# Patient Record
Sex: Female | Born: 1975 | Race: White | Hispanic: Yes | Marital: Married | Smoking: Never smoker
Health system: Southern US, Community
[De-identification: ages and names within clinical notes are randomized; demographics above are authoritative.]

## PROBLEM LIST (undated history)

## (undated) ENCOUNTER — Inpatient Hospital Stay: Payer: Self-pay

## (undated) DIAGNOSIS — T7840XA Allergy, unspecified, initial encounter: Secondary | ICD-10-CM

## (undated) DIAGNOSIS — J45909 Unspecified asthma, uncomplicated: Secondary | ICD-10-CM

## (undated) HISTORY — DX: Allergy, unspecified, initial encounter: T78.40XA

## (undated) HISTORY — PX: DILATION AND CURETTAGE OF UTERUS: SHX78

## (undated) HISTORY — DX: Unspecified asthma, uncomplicated: J45.909

## (undated) HISTORY — PX: LEEP: SHX91

---

## 2013-08-14 ENCOUNTER — Ambulatory Visit (INDEPENDENT_AMBULATORY_CARE_PROVIDER_SITE_OTHER): Payer: 59 | Admitting: Medical

## 2013-08-14 ENCOUNTER — Encounter: Payer: Self-pay | Admitting: Medical

## 2013-08-14 VITALS — BP 100/80 | HR 88 | Temp 98.2°F | Resp 16 | Ht 65.0 in | Wt 143.0 lb

## 2013-08-14 DIAGNOSIS — J029 Acute pharyngitis, unspecified: Secondary | ICD-10-CM

## 2013-08-14 DIAGNOSIS — R51 Headache: Secondary | ICD-10-CM

## 2013-08-14 DIAGNOSIS — R509 Fever, unspecified: Secondary | ICD-10-CM

## 2013-08-14 LAB — CBC WITH DIFFERENTIAL/PLATELET
HCT: 39.9 % (ref 36.0–46.0)
Hemoglobin: 13.5 g/dL (ref 12.0–15.0)
Lymphocytes Relative: 44 % (ref 12–46)
Lymphs Abs: 3.3 10*3/uL (ref 0.7–4.0)
MCH: 29.5 pg (ref 26.0–34.0)
MCV: 87.1 fL (ref 78.0–100.0)
Monocytes Absolute: 0.7 10*3/uL (ref 0.1–1.0)
Monocytes Relative: 9 % (ref 3–12)
Platelets: 129 10*3/uL — ABNORMAL LOW (ref 150–400)
RBC: 4.58 MIL/uL (ref 3.87–5.11)
WBC: 7.5 10*3/uL (ref 4.0–10.5)

## 2013-08-14 LAB — BASIC METABOLIC PANEL
BUN: 8 mg/dL (ref 6–23)
Calcium: 9.7 mg/dL (ref 8.4–10.5)
Chloride: 102 mEq/L (ref 96–112)
Creat: 0.8 mg/dL (ref 0.50–1.10)

## 2013-08-14 LAB — MONONUCLEOSIS SCREEN: Mono Screen: NEGATIVE

## 2013-08-14 MED ORDER — PROMETHAZINE HCL 25 MG/ML IJ SOLN
25.0000 mg | Freq: Once | INTRAMUSCULAR | Status: AC
  Administered 2013-08-14: 25 mg via INTRAMUSCULAR

## 2013-08-14 MED ORDER — METHYLPREDNISOLONE SODIUM SUCC 125 MG IJ SOLR
125.0000 mg | Freq: Once | INTRAMUSCULAR | Status: AC
  Administered 2013-08-14: 125 mg via INTRAMUSCULAR

## 2013-08-14 NOTE — Progress Notes (Signed)
Subjective: Here as a new patient today.  Moved from [place] to here back in March.  Friday over a week ago had a canker sore in her mouth, couldn't eat for a few days.  Wasn't feeling good through the weekend.  Started taking tea, airborne OTC.  Then last Monday 8 days ago had a migraine, nausea, went to urgent care, had shot for nausea and pain.   Sent her home.   She later that day got fever and chills.  Started getting bad sore throat over night.   [facility], was + for strep.  Was given penicillin for 10 days.  Was also given cough syrup that makes her drowsy.  By the end of the week just wasn't feeling much better, still running fever.   Went back to urgent care Friday 3 days ago.  Was advised she may have sinus infection too.  Was changed to different antibiotic, 5 day script and prednisone, headache medication too (butalbital) that does help the headaches.   She only used 1 tablet of prednisone 50mg .  Currently has headache, less than the migraine, but intense last night, fever 100.8 last night, ears feel clogged, pain in upper back and neck/soreness, occasional chills.  Used ibuprofen last night, tylenol this morning.  Finished zpak this morning.  No rash, no abdominal pain, no vomiting or diarrhea, no GU c/o.  No joint swelling.   ROS as in subjective  Objective: General: Well-developed, well-nourished, ill-appearing, lying on exam table with cloth blocking her eyes Skin: Warm to hot, dry, no rash HEENT: Tender over frontal and maxillary sinus and tender over her head in general, TMs pearly, nares with turbinate edema and clear discharge, pharynx with mild erythema Neck: Supple, mild generalized tenderness, normal range of motion, no lymphadenopathy or mass Lungs clear Heart: Regular in rhythm, normal S1, S2, no murmurs Abdomen: Positive bowel sounds, soft, nontender, no mass, no organomegaly Back: Nontender Extremities: no edema Neuro: CN II through XII intact,  alert and oriented x3, normal strength and sensation, normal DTRs, nonfocal exam, no meningeal signs   Assessment: Encounter Diagnoses  Name Primary?  . Headache(784.0) Yes  . Fever, unspecified   . Sore throat     Plan: Discussed her concerns, her recent evaluation treatments.  At this point she has been on one course of azithromycin for 5 days, at least 3-4 days of penicillin, she really does not have much cough, no obvious meningitis. She apparently was positive for strep last week. At this point it appears that she has a moderate headache that is the primary problem, still getting over a respiratory infection.  We discussed treatment options, we will check some labs today. We ended up giving 125 mg Solu-Medrol and 25 mg of Phenergan, discussed risk and benefits of both medications. She will go home and rest and increase her water intake and try to stay hydrated. We will call with lab results, and advised her to recheck her call back if not seeing significant improvement within the next 36 hours.  She voices understanding and agreement with plan. She will hold off on any more antibiotics, she will hold off on taking the prednisone that she has, and her friend will take her home and watch over her.

## 2013-08-14 NOTE — Patient Instructions (Signed)
We gave you a shot of Solu-Medrol and Phenergan in our office today.  Go home and rest in the bed, stay hydrated throughout the day with plenty of clear fluids such as water and ginger ale, soup broth etc.  Eat bland small portions today so this won't weigh heavy on your stomach.  We will call with lab results.  If you are no better in regards to the headache by lunch time, you can take another of the butalbital tablet.  You can take the Tessalon cough tablets if needed  At this point unless we call you back with other directions, stop the penicillin, stop the prednisone, stop Tylenol.   If not much better by tomorrow, or further concerns call back.

## 2013-08-17 ENCOUNTER — Telehealth: Payer: Self-pay | Admitting: Internal Medicine

## 2013-08-17 ENCOUNTER — Other Ambulatory Visit: Payer: Self-pay | Admitting: Medical

## 2013-08-17 ENCOUNTER — Ambulatory Visit
Admission: RE | Admit: 2013-08-17 | Discharge: 2013-08-17 | Disposition: A | Discharge status: Home / Self Care | Payer: 59 | Source: Ambulatory Visit | Attending: Medical | Admitting: Medical

## 2013-08-17 DIAGNOSIS — R509 Fever, unspecified: Secondary | ICD-10-CM

## 2013-08-17 NOTE — Telephone Encounter (Signed)
Pt states that her fever has been still high, last night was 101.4  She took some meds and it went down to 100.4 and she didn't have a fever this morning but she is having night sweats. She wanted to know what to do.

## 2013-08-17 NOTE — Telephone Encounter (Signed)
pls call and get details on all her symptoms currently.  Also, how bad is headache currently?  Any rash?  Any better, way worse??

## 2013-08-17 NOTE — Telephone Encounter (Signed)
Patient states that she doesn't have a HA. She said her head feels heavy. She feels light headed and fever keep coming back at night 101.8 and the night sweats. CLS

## 2013-08-20 ENCOUNTER — Telehealth: Payer: Self-pay | Admitting: Family Medicine

## 2013-08-20 NOTE — Telephone Encounter (Signed)
i assume she has finished the penicillin.  Is she using any OTC decongestant, sudafed, or other?  Any other symptoms at this time?

## 2013-08-20 NOTE — Telephone Encounter (Signed)
C/t penicillin.  Can do sudafed or benadryl or other sinus decongestant if not allergic or have high blood pressure

## 2013-08-20 NOTE — Telephone Encounter (Signed)
Patient states that she still has 4 to 5 days left of the PCN. She is not taking a decongestant. She is only taking Advil. She doesn't have any other Sx than what I type earlier. She said that she was going to go by the pharmacy and get some Sudafed and she wants to know would it be okay to take this along with everything else she is taking. CLS

## 2013-08-20 NOTE — Telephone Encounter (Signed)
Patient is aware of Morgan Pham PAC recommendations. CLS 

## 2013-08-20 NOTE — Telephone Encounter (Signed)
Patient called to up date her Sx. She said that she is not 100% today but better than she was. She slept all night but she states that she feels pressure behind her left side of her eye. Is there anything she needs to do next? CLS [phone]

## 2013-08-21 NOTE — Telephone Encounter (Signed)
Pt states she has broke out in a rash this morning

## 2013-08-21 NOTE — Telephone Encounter (Signed)
I spoke with the patient and she has an appointment for 08/22/13. CLS

## 2013-08-21 NOTE — Telephone Encounter (Signed)
Let us just have her return

## 2013-08-22 ENCOUNTER — Ambulatory Visit (INDEPENDENT_AMBULATORY_CARE_PROVIDER_SITE_OTHER): Payer: 59 | Admitting: Medical

## 2013-08-22 ENCOUNTER — Encounter: Payer: Self-pay | Admitting: Medical

## 2013-08-22 VITALS — BP 110/70 | HR 72 | Temp 98.5°F | Resp 16 | Wt 142.0 lb

## 2013-08-22 DIAGNOSIS — R21 Rash and other nonspecific skin eruption: Secondary | ICD-10-CM

## 2013-08-22 DIAGNOSIS — J069 Acute upper respiratory infection, unspecified: Secondary | ICD-10-CM

## 2013-08-22 DIAGNOSIS — R51 Headache: Secondary | ICD-10-CM

## 2013-08-22 DIAGNOSIS — T887XXA Unspecified adverse effect of drug or medicament, initial encounter: Secondary | ICD-10-CM

## 2013-08-22 DIAGNOSIS — T50905A Adverse effect of unspecified drugs, medicaments and biological substances, initial encounter: Secondary | ICD-10-CM

## 2013-08-22 MED ORDER — FLUTICASONE PROPIONATE 50 MCG/ACT NA SUSP: 2.0000 | Freq: Every day | NASAL | Status: DC

## 2013-08-22 NOTE — Progress Notes (Signed)
  Subjective:  Morgan Pham is a 38 y.o. female who presents for recheck.  I saw her as a new patient recently for bad headache, URI, and we ended up getting CXR to rule out pneumonia.    She restarted penicillin but with a few days got a rash throughout, worse on face.  She stopped penicillin, began benadryl, and rash is improving.    She overall feels fine except a little nasal congestion at this point.  No other aggravating or relieving factors.    No other c/o.  The following portions of the patient's history were reviewed and updated as appropriate: allergies, current medications, past family history, past medical history, past social history, past surgical history and problem list.  ROS Otherwise as in subjective above  Objective: Physical Exam  BP 110/70  Pulse 72  Temp(Src) 98.5 F (36.9 C) (Oral)  Resp 16  Wt 142 lb (64.411 kg)   General appearance: alert, no distress, WD/WN Skin: faint macular rash of legs and arms, faint rash of face HEENT: normocephalic, sclerae anicteric, conjunctiva pink and moist, TMs pearly, nares with clear discharge, pharynx normal Oral cavity: MMM, no lesions Neck: supple, no lymphadenopathy, no thyromegaly, no masses Heart: RRR, normal S1, S2, no murmurs Lungs: CTA bilaterally, no wheezes, rhonchi, or rales Pulses: 2+ radial pulses, 2+ pedal pulses, normal cap refill Ext: no edema   Assessment: Encounter Diagnoses  Name Primary?  . Rash Yes  . Adverse reaction to drug, initial encounter   . Headache(784.0)   . Upper respiratory infection      Plan: Rash, likely reaction to penicillin.  Updated allergies.  Will avoid penicillins.  She has stopped PCN Headache - resolved URI - mostly resolved.   Can use benadryl and flonase for another week or so, rest, hydrate well, return prn

## 2013-08-22 NOTE — Patient Instructions (Signed)
Currently you are mostly resolved with the respiratory infection.  Current recommendations:  Begin Flonase nasal spray, 1-2 sprays twice daily.  Do this for 1-2 weeks, then stop  Continue Benadryl OTC, 1/2-1 tablet twice daily for another week or so  Drink plenty of water  Use nasal saline flush twice daily for a week   And if not completely resolved within 10-14 days , then let me know  Using Saline Nose Drops with Bulb Syringe A bulb syringe is used to clear your nose. You may use it when you have a stuffy nose, nasal congestion, sinus pressure, or sneezing.   SALINE SOLUTION You can buy nose drops at your local drug store. You can also make nose drops yourself. Mix 1 cup of water with  teaspoon of salt. Stir. Store this mixture at room temperature. Make a new batch daily.  USE THE BULB IN COMBINATION WITH SALINE NOSE DROPS  Squeeze the air out of the bulb before suctioning the saline mixture.  While still squeezing the bulb flat, place the tip of the bulb into the saline mixture.  Let air come back into the bulb.  This will suction up the saline mixture.  Gently flush one nostril at a time.  Salt water nose drops will then moisten your  congested nose and loosen secretions before suctioning.  Use the bulb syringe as directed below to suction.  USING THE BULB SYRINGE TO SUCTION  While still squeezing the bulb flat, place the tip of the bulb into a nostril. Let air come back into the bulb. The suction will pull snot out of the nose and into the bulb.  Repeat on the other nostril.  Squeeze syringe several times into a tissue.  CLEANING THE BULB SYRINGE  Clean the bulb syringe every day with hot soapy water.  Clean the inside of the bulb by squeezing the bulb while the tip is in soapy water.  Rinse by squeezing the bulb while the tip is in clean hot water.  Store the bulb with the tip side down on paper towel.  HOME CARE INSTRUCTIONS   Use saline nose drops often  to keep the nose open and not stuffy.  Throw away used salt water. Make a new solution every time.  Do not use the same solution and dropper for another person  If you do not prefer to use nasal saline flush, other options include nasal saline spray or the EchoStareti Pot, both of which are available over the counter at your pharmacy.

## 2014-10-21 IMAGING — CR DG CHEST 2V
2 series · 2 of 2 positions shown · non-contrast
Comparison: None.

CLINICAL DATA: Cough

EXAM:
CHEST  2 VIEW

[view not recorded (1 of 2)]
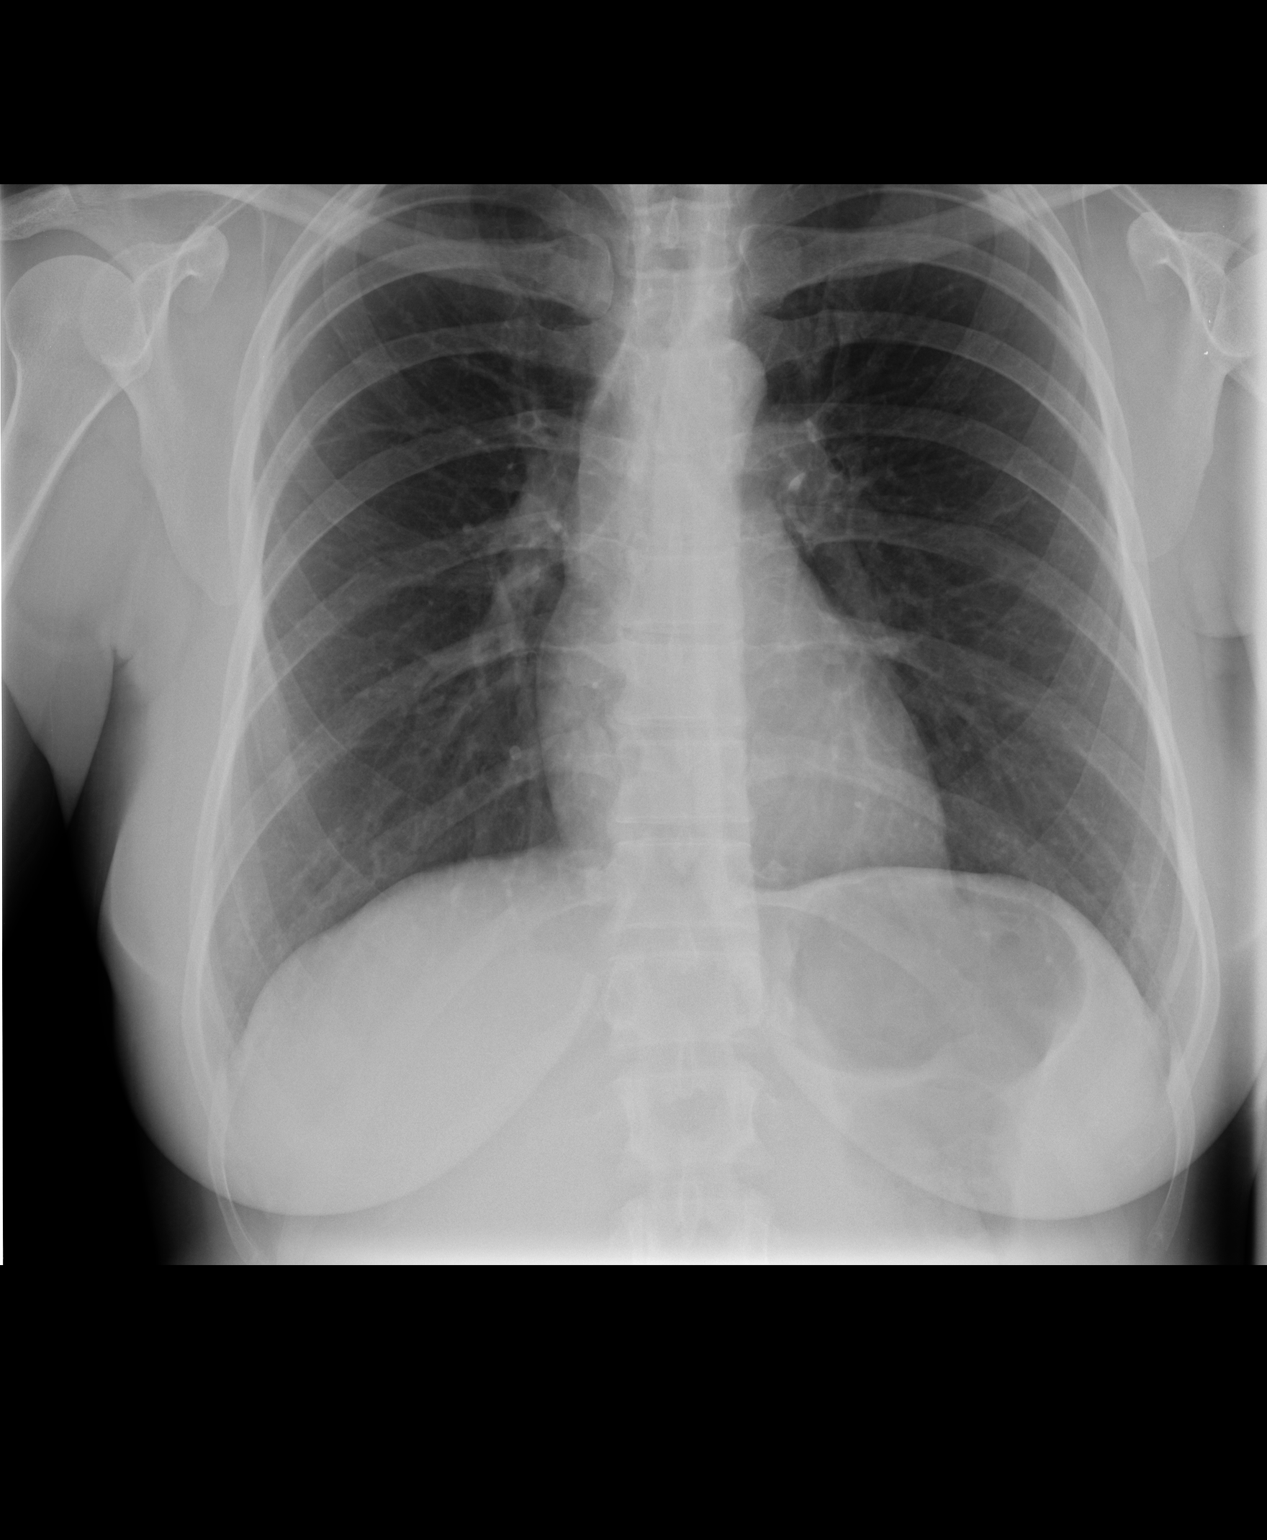

[view not recorded (2 of 2)]
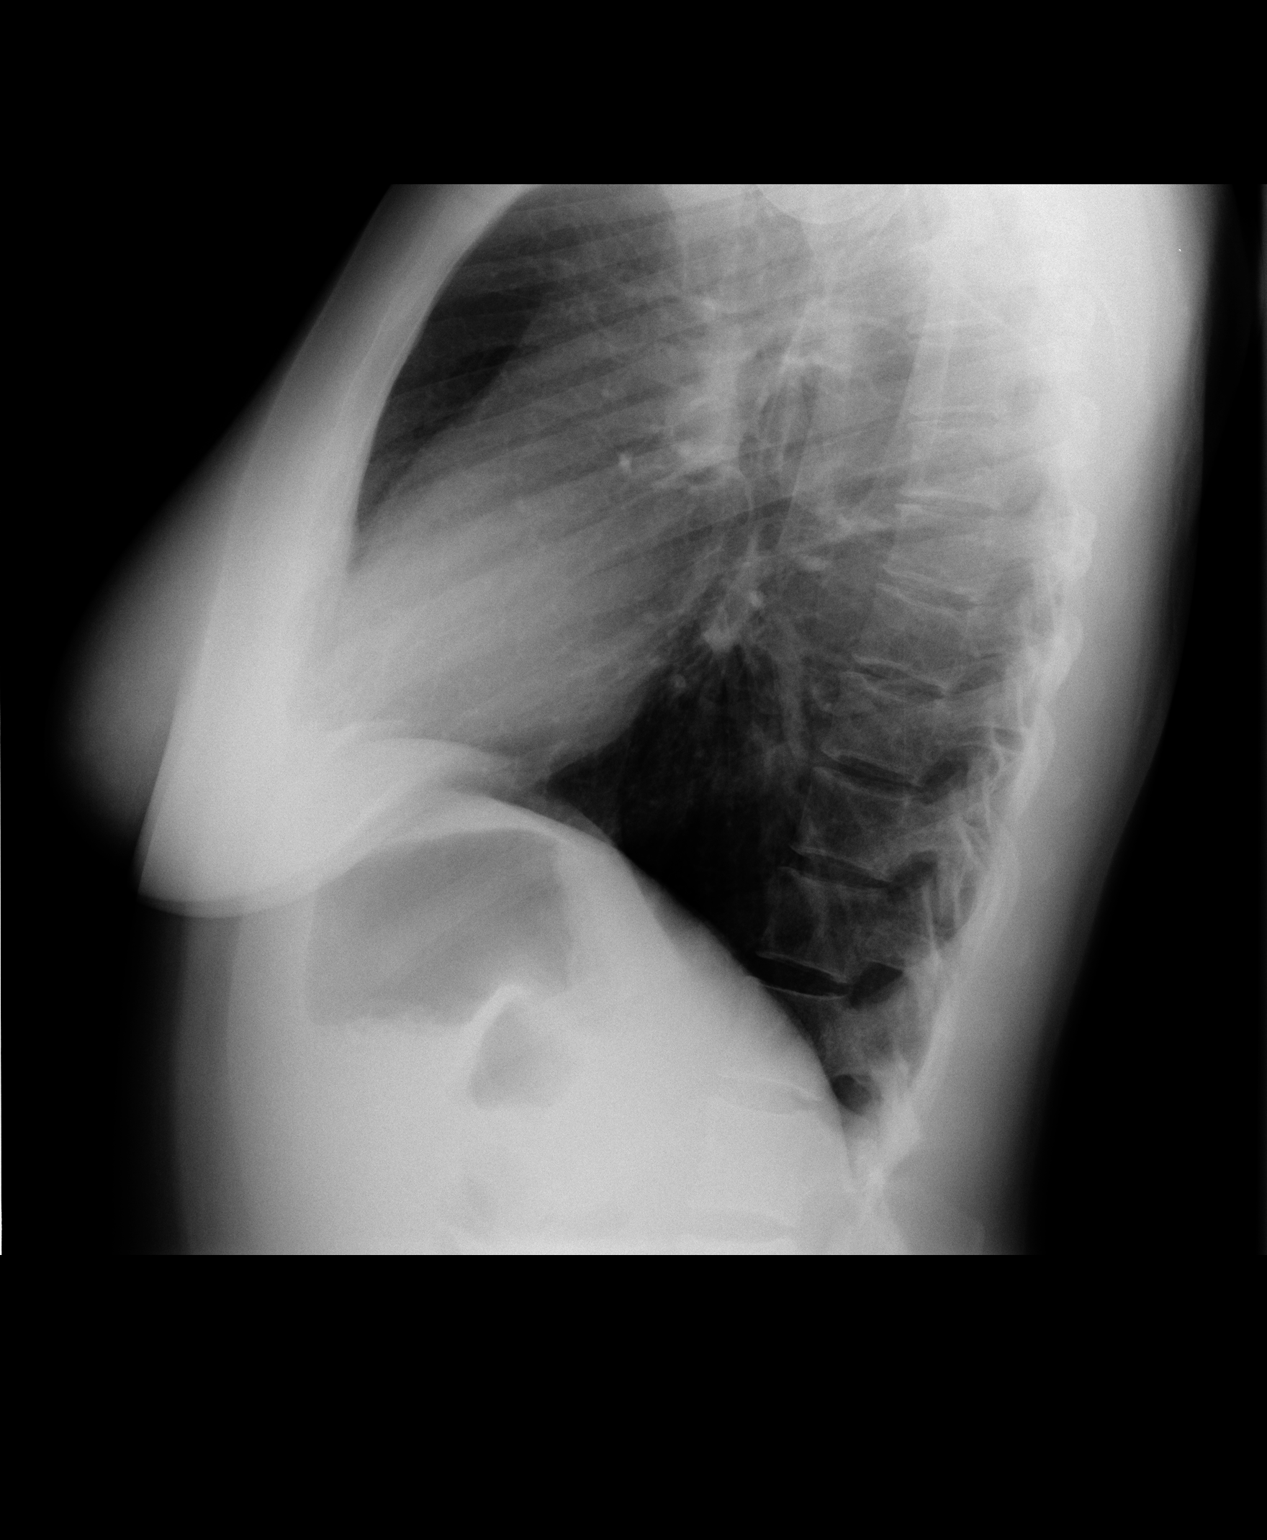

[2 of 2 positions shown; findings below may reference images not displayed]

FINDINGS: The heart size and mediastinal contours are within normal limits.
Both lungs are clear. The visualized skeletal structures are
unremarkable.
IMPRESSION: No active cardiopulmonary disease.

## 2015-06-02 ENCOUNTER — Telehealth: Payer: Self-pay | Admitting: Obstetrics and Gynecology

## 2015-06-02 ENCOUNTER — Encounter: Payer: Self-pay | Admitting: Family Medicine

## 2015-06-02 ENCOUNTER — Ambulatory Visit (INDEPENDENT_AMBULATORY_CARE_PROVIDER_SITE_OTHER): Payer: 59 | Admitting: Family Medicine

## 2015-06-02 VITALS — BP 118/78 | HR 68 | Temp 98.0°F | Wt 160.2 lb

## 2015-06-02 DIAGNOSIS — R42 Dizziness and giddiness: Secondary | ICD-10-CM | POA: Diagnosis not present

## 2015-06-02 DIAGNOSIS — Z8639 Personal history of other endocrine, nutritional and metabolic disease: Secondary | ICD-10-CM

## 2015-06-02 DIAGNOSIS — N939 Abnormal uterine and vaginal bleeding, unspecified: Secondary | ICD-10-CM | POA: Diagnosis not present

## 2015-06-02 DIAGNOSIS — R5383 Other fatigue: Secondary | ICD-10-CM | POA: Diagnosis not present

## 2015-06-02 DIAGNOSIS — Z23 Encounter for immunization: Secondary | ICD-10-CM

## 2015-06-02 DIAGNOSIS — N923 Ovulation bleeding: Secondary | ICD-10-CM

## 2015-06-02 DIAGNOSIS — K148 Other diseases of tongue: Secondary | ICD-10-CM

## 2015-06-02 LAB — CBC WITH DIFFERENTIAL/PLATELET
BASOS PCT: 0 % (ref 0–1)
Basophils Absolute: 0 10*3/uL (ref 0.0–0.1)
EOS ABS: 0.1 10*3/uL (ref 0.0–0.7)
Eosinophils Relative: 2 % (ref 0–5)
HCT: 38.3 % (ref 36.0–46.0)
HEMOGLOBIN: 12.5 g/dL (ref 12.0–15.0)
Lymphocytes Relative: 32 % (ref 12–46)
Lymphs Abs: 2.3 10*3/uL (ref 0.7–4.0)
MCH: 28.3 pg (ref 26.0–34.0)
MCHC: 32.6 g/dL (ref 30.0–36.0)
MCV: 86.8 fL (ref 78.0–100.0)
MONO ABS: 0.3 10*3/uL (ref 0.1–1.0)
MONOS PCT: 4 % (ref 3–12)
MPV: 12.5 fL — ABNORMAL HIGH (ref 8.6–12.4)
NEUTROS ABS: 4.4 10*3/uL (ref 1.7–7.7)
Neutrophils Relative %: 62 % (ref 43–77)
Platelets: 198 10*3/uL (ref 150–400)
RBC: 4.41 MIL/uL (ref 3.87–5.11)
RDW: 13.3 % (ref 11.5–15.5)
WBC: 7.1 10*3/uL (ref 4.0–10.5)

## 2015-06-02 LAB — COMPREHENSIVE METABOLIC PANEL
ALBUMIN: 4.2 g/dL (ref 3.6–5.1)
ALT: 18 U/L (ref 6–29)
AST: 18 U/L (ref 10–30)
Alkaline Phosphatase: 51 U/L (ref 33–115)
BUN: 13 mg/dL (ref 7–25)
CHLORIDE: 105 mmol/L (ref 98–110)
CO2: 21 mmol/L (ref 20–31)
CREATININE: 0.79 mg/dL (ref 0.50–1.10)
Calcium: 9.3 mg/dL (ref 8.6–10.2)
Glucose, Bld: 93 mg/dL (ref 65–99)
Potassium: 4.4 mmol/L (ref 3.5–5.3)
SODIUM: 136 mmol/L (ref 135–146)
Total Bilirubin: 0.3 mg/dL (ref 0.2–1.2)
Total Protein: 7.3 g/dL (ref 6.1–8.1)

## 2015-06-02 NOTE — Telephone Encounter (Signed)
Pt is bleding after sex, has a rash on inner thigh area left into her vagina area

## 2015-06-02 NOTE — Progress Notes (Signed)
Subjective:    Patient ID: Morgan Pham, female    DOB: 03/05/1976, 39 y.o.   MRN: [id number]  HPI Chief Complaint  Patient presents with  . multiple issues    rash between her thigh that hasnt gone away since a week ago- irriated, itchy.. not feeling well, tired all the time had some dizzy spells last week- has been vitamin d definency (levels were fine when she went to obgyn back in may. pt has a circle on her tongue that came up and was white at first and it is faded but still there now.- does not hurt     She is here with multiple complaints.  States she has been feeling tired and wanting to sleep more than usual for past 2 weeks. States she is sleeping well but just not feeling rested when she wakes up and continues to feel like she is dragging throughout the day. Does not fall asleep during the day.  She denies feeling sad or blue and states she is able to function and perform normal daily activities. She reports having 2 brief episodes of feeling weak and dizzy last week. States both episodes occurred during the day and lasted only seconds. Denies feeling dizzy for past 4 days. Denies headache, fever, chills, unexplained weight loss, chest pain, shortness of breath, blood in stool, urinary symptoms, abdominal pain or vaginal discharge.  Reports having normal Vitamin D level in past 6 months but she has not been taking Vitamin D Reports menstrual cycle this month was abnormal, having spotting now after having her normal cycle, and she thinks this is due to new OCP. She has an OB/GYN and is following up with them for this.  Also complains of pruritic rash to her left inner thigh area for about a week. States itchiness at night. Has tried cold water and calamine lotion with some relief.  Also complains of "white spot" on tongue 2 months ago that is not as obvious, no longer white, but still present. States initially her tongue felt like she had burned it on something but that sensation quickly  went away. Reports normal sensation and taste, denies pain. She is not a smoker. States she if overdue for dental appointment, last visit 2 years ago.  Reviewed allergies, medications, past medical and social history.  Review of Systems Pertinent positives and negatives in the history of present illness    Objective:   Physical Exam  Alert and oriented and in no distress. PERRLA, EOMs intact, CN II-IX intact. Tympanic membranes and canals are normal. Pharyngeal area is normal. Neck is supple without adenopathy. Cardiac exam shows a regular rate and  rhythm without murmurs or gallops. Lungs are clear to auscultation. Tongue- approximately 1 cm in diameter round slightly ulcerated area to left mid tongue. Left thigh fold with erythematous, raised plaques that extends to left labia majora without maceration or weeping.  BP 118/78 mmHg  Pulse 68  Temp(Src) 98 F (36.7 C) (Oral)  Wt 160 lb 3.2 oz (72.666 kg) Orthostatic vitals: Not postural     Assessment & Plan:  Other fatigue - Plan: CBC with Differential/Platelet, Comprehensive metabolic panel, TSH  Dizzy - Plan: Orthostatic vital signs, CANCELED: Orthostatic vital signs  History of vitamin D deficiency  Tongue lesion  Need for prophylactic vaccination and inoculation against influenza - Plan: Flu Vaccine QUAD 36+ mos IM  Spotting between menses  Recommend that she see a dentist for tongue lesion and she is overdue for regular visit. She  will call her insurance company to find a dentist.  Discussed that she should be taking Vitamin D and a good multivitamin.  Will check labs for cause of fatigue, possible that this is related to hormones and new OCP. She will follow up with OB/GYN who prescribes OCP.  Suspect rash to left inner thigh is due to fungus. Recommended trying Lamisil over-the-counter for the next 2 weeks. Also recommended keeping the area clean and dry and avoid tight clothing. She will let me know if no  improvement. Dizziness was brief, lasting a few seconds on 2 occasions last week and she has not had any recurring dizziness. Recommend that she stay well-hydrated and eat regular meals. She will let me know if she has more episodes.  Flu shot given. Spent at least 30 minutes in counseling and coordination of care.

## 2015-06-02 NOTE — Patient Instructions (Signed)
Call your insurance company and find out which dentist they recommend.  Use Lamisil twice daily for next 2 weeks to left inner thigh and see if this helps.   Intertrigo Intertrigo is a skin condition that occurs in between folds of skin in places on the body that rub together a lot and do not get much ventilation. It is caused by heat, moisture, friction, sweat retention, and lack of air circulation, which produces red, irritated patches and, sometimes, scaling or drainage. People who have diabetes, who are obese, or who have treatment with antibiotics are at increased risk for intertrigo. The most common sites for intertrigo to occur include:  The groin.  The breasts.  The armpits.  Folds of abdominal skin.  Webbed spaces between the fingers or toes. Intertrigo may be aggravated by:  Sweat.  Feces.  Yeast or bacteria that are present near skin folds.  Urine.  Vaginal discharge. HOME CARE INSTRUCTIONS  The following steps can be taken to reduce friction and keep the affected area cool and dry:  Expose skin folds to the air.  Keep deep skin folds separated with cotton or linen cloth. Avoid tight fitting clothing that could cause chafing.  Wear open-toed shoes or sandals to help reduce moisture between the toes.  Apply absorbent powders to affected areas as directed by your caregiver.  Apply over-the-counter barrier pastes, such as zinc oxide, as directed by your caregiver.  If you develop a fungal infection in the affected area, your caregiver may have you use antifungal creams. SEEK MEDICAL CARE IF:   The rash is not improving after 1 week of treatment.  The rash is getting worse (more red, more swollen, more painful, or spreading).  You have a fever or chills. MAKE SURE YOU:   Understand these instructions.  Will watch your condition.  Will get help right away if you are not doing well or get worse.   This information is not intended to replace advice given  to you by your health care provider. Make sure you discuss any questions you have with your health care provider.   Document Released: 08/02/2005 Document Revised: 10/25/2011 Document Reviewed: 02/03/2015 Elsevier Interactive Patient Education [employer]2016 [employer]

## 2015-06-02 NOTE — Telephone Encounter (Signed)
Tried calling patient several times, na Looks as if [facility]

## 2015-06-03 LAB — TSH: TSH: 2.805 u[IU]/mL (ref 0.350–4.500)

## 2015-08-17 NOTE — L&D Delivery Note (Signed)
Delivery Summary for Sherron Alesuth Whetstine  Labor Events:   Preterm labor:   Rupture date:   Rupture time:   Rupture type: Spontaneous  Fluid Color: Clear  Induction:   Augmentation:   Complications:   Cervical ripening:          Delivery:   Episiotomy:   Lacerations:   Repair suture:   Repair # of packets:   Blood loss (ml):    Information for the patient's newborn:  Jaynie Collinsspinal, Boy Mia [[id number]][[id number]]    Delivery 03/28/2016 7:14 AM by  C-Section, Low Transverse Sex:  female Gestational Age: 4066w3d Delivery Clinician:  Hildred LaserAnika Caryl Fate, MD Living?: Yes        APGARS  One minute Five minutes Ten minutes  Skin color:  1 1    Heart rate:  2 2    Grimace:  1 2    Muscle tone:  2 2    Breathing:  2 2    Totals: 8  9      Presentation/position:      Resuscitation:   Cord information:    Disposition of cord blood:     Blood gases sent?  Complications:   Placenta: Delivered:       appearance Newborn Measurements: Weight: 4 lb 3.7 oz (1920 g)  Height:    Head circumference:    Chest circumference:    Other providers:    Additional  information: Forceps:   Vacuum:   Breech:   Observed anomalies       See Dr. Oretha Milchherry's operative note for details of C-section procedure.   Hildred LaserAnika Quantay Zaremba, MD [facility]

## 2015-09-19 ENCOUNTER — Encounter: Payer: Self-pay | Admitting: Obstetrics and Gynecology

## 2015-09-19 ENCOUNTER — Ambulatory Visit (INDEPENDENT_AMBULATORY_CARE_PROVIDER_SITE_OTHER): Payer: 59 | Admitting: Obstetrics and Gynecology

## 2015-09-19 VITALS — BP 118/74 | HR 76 | Ht 64.0 in | Wt 157.2 lb

## 2015-09-19 DIAGNOSIS — N926 Irregular menstruation, unspecified: Secondary | ICD-10-CM | POA: Diagnosis not present

## 2015-09-19 LAB — POCT URINE PREGNANCY: PREG TEST UR: POSITIVE — AB

## 2015-09-19 NOTE — Progress Notes (Signed)
Patient ID: Morgan Pham, female   DOB: 11-17-1975, 40 y.o.   MRN: [id number]  Here for pregnancy confirmation with planned pregnancy, stopped OCPs last month and is taking OTC PNVs. LMP 08/14/15, EGA [redacted]w[redacted]d, EDC 05/20/16  O: A&O x4  well groomed and excited female in no distress UPT+ Blood pressure 118/74, pulse 76, height  (1.626 m), weight 157 lb 3.2 oz (71.305 kg), last menstrual period 08/14/2015.  A: missed menses Early pregnancy  P: RTC in 2 weeks for viability scan and nurse intake & labs, then in 6 weeks for NOB PE.  Harlow Mares, CNM

## 2015-09-19 NOTE — Patient Instructions (Signed)
First Trimester of Pregnancy The first trimester of pregnancy is from week 1 until the end of week 12 (months 1 through 3). A week after a sperm fertilizes an egg, the egg will implant on the wall of the uterus. This embryo will begin to develop into a baby. Genes from you and your partner are forming the baby. The female genes determine whether the baby is a boy or a girl. At 6-8 weeks, the eyes and face are formed, and the heartbeat can be seen on ultrasound. At the end of 12 weeks, all the baby's organs are formed.  Now that you are pregnant, you will want to do everything you can to have a healthy baby. Two of the most important things are to get good prenatal care and to follow your health care provider's instructions. Prenatal care is all the medical care you receive before the baby's birth. This care will help prevent, find, and treat any problems during the pregnancy and childbirth. BODY CHANGES Your body goes through many changes during pregnancy. The changes vary from woman to woman.   You may gain or lose a couple of pounds at first.  You may feel sick to your stomach (nauseous) and throw up (vomit). If the vomiting is uncontrollable, call your health care provider.  You may tire easily.  You may develop headaches that can be relieved by medicines approved by your health care provider.  You may urinate more often. Painful urination may mean you have a bladder infection.  You may develop heartburn as a result of your pregnancy.  You may develop constipation because certain hormones are causing the muscles that push waste through your intestines to slow down.  You may develop hemorrhoids or swollen, bulging veins (varicose veins).  Your breasts may begin to grow larger and become tender. Your nipples may stick out more, and the tissue that surrounds them (areola) may become darker.  Your gums may bleed and may be sensitive to brushing and flossing.  Dark spots or blotches (chloasma,  mask of pregnancy) may develop on your face. This will likely fade after the baby is born.  Your menstrual periods will stop.  You may have a loss of appetite.  You may develop cravings for certain kinds of food.  You may have changes in your emotions from day to day, such as being excited to be pregnant or being concerned that something may go wrong with the pregnancy and baby.  You may have more vivid and strange dreams.  You may have changes in your hair. These can include thickening of your hair, rapid growth, and changes in texture. Some women also have hair loss during or after pregnancy, or hair that feels dry or thin. Your hair will most likely return to normal after your baby is born. WHAT TO EXPECT AT YOUR PRENATAL VISITS During a routine prenatal visit:  You will be weighed to make sure you and the baby are growing normally.  Your blood pressure will be taken.  Your abdomen will be measured to track your baby's growth.  The fetal heartbeat will be listened to starting around week 10 or 12 of your pregnancy.  Test results from any previous visits will be discussed. Your health care provider may ask you:  How you are feeling.  If you are feeling the baby move.  If you have had any abnormal symptoms, such as leaking fluid, bleeding, severe headaches, or abdominal cramping.  If you are using any tobacco products,   including cigarettes, chewing tobacco, and electronic cigarettes.  If you have any questions. Other tests that may be performed during your first trimester include:  Blood tests to find your blood type and to check for the presence of any previous infections. They will also be used to check for low iron levels (anemia) and Rh antibodies. Later in the pregnancy, blood tests for diabetes will be done along with other tests if problems develop.  Urine tests to check for infections, diabetes, or protein in the urine.  An ultrasound to confirm the proper growth  and development of the baby.  An amniocentesis to check for possible genetic problems.  Fetal screens for spina bifida and Down syndrome.  You may need other tests to make sure you and the baby are doing well.  HIV (human immunodeficiency virus) testing. Routine prenatal testing includes screening for HIV, unless you choose not to have this test. HOME CARE INSTRUCTIONS  Medicines  Follow your health care provider's instructions regarding medicine use. Specific medicines may be either safe or unsafe to take during pregnancy.  Take your prenatal vitamins as directed.  If you develop constipation, try taking a stool softener if your health care provider approves. Diet  Eat regular, well-balanced meals. Choose a variety of foods, such as meat or vegetable-based protein, fish, milk and low-fat dairy products, vegetables, fruits, and whole grain breads and cereals. Your health care provider will help you determine the amount of weight gain that is right for you.  Avoid raw meat and uncooked cheese. These carry germs that can cause birth defects in the baby.  Eating four or five small meals rather than three large meals a day may help relieve nausea and vomiting. If you start to feel nauseous, eating a few soda crackers can be helpful. Drinking liquids between meals instead of during meals also seems to help nausea and vomiting.  If you develop constipation, eat more high-fiber foods, such as fresh vegetables or fruit and whole grains. Drink enough fluids to keep your urine clear or pale yellow. Activity and Exercise  Exercise only as directed by your health care provider. Exercising will help you:  Control your weight.  Stay in shape.  Be prepared for labor and delivery.  Experiencing pain or cramping in the lower abdomen or low back is a good sign that you should stop exercising. Check with your health care provider before continuing normal exercises.  Try to avoid standing for long  periods of time. Move your legs often if you must stand in one place for a long time.  Avoid heavy lifting.  Wear low-heeled shoes, and practice good posture.  You may continue to have sex unless your health care provider directs you otherwise. Relief of Pain or Discomfort  Wear a good support bra for breast tenderness.   Take warm sitz baths to soothe any pain or discomfort caused by hemorrhoids. Use hemorrhoid cream if your health care provider approves.   Rest with your legs elevated if you have leg cramps or low back pain.  If you develop varicose veins in your legs, wear support hose. Elevate your feet for 15 minutes, 3-4 times a day. Limit salt in your diet. Prenatal Care  Schedule your prenatal visits by the twelfth week of pregnancy. They are usually scheduled monthly at first, then more often in the last 2 months before delivery.  Write down your questions. Take them to your prenatal visits.  Keep all your prenatal visits as directed by your   health care provider. Safety  Wear your seat belt at all times when driving.  Make a list of emergency phone numbers, including numbers for family, friends, the hospital, and police and fire departments. General Tips  Ask your health care provider for a referral to a local prenatal education class. Begin classes no later than at the beginning of month 6 of your pregnancy.  Ask for help if you have counseling or nutritional needs during pregnancy. Your health care provider can offer advice or refer you to specialists for help with various needs.  Do not use hot tubs, steam rooms, or saunas.  Do not douche or use tampons or scented sanitary pads.  Do not cross your legs for long periods of time.  Avoid cat litter boxes and soil used by cats. These carry germs that can cause birth defects in the baby and possibly loss of the fetus by miscarriage or stillbirth.  Avoid all smoking, herbs, alcohol, and medicines not prescribed by  your health care provider. Chemicals in these affect the formation and growth of the baby.  Do not use any tobacco products, including cigarettes, chewing tobacco, and electronic cigarettes. If you need help quitting, ask your health care provider. You may receive counseling support and other resources to help you quit.  Schedule a dentist appointment. At home, brush your teeth with a soft toothbrush and be gentle when you floss. SEEK MEDICAL CARE IF:   You have dizziness.  You have mild pelvic cramps, pelvic pressure, or nagging pain in the abdominal area.  You have persistent nausea, vomiting, or diarrhea.  You have a bad smelling vaginal discharge.  You have pain with urination.  You notice increased swelling in your face, hands, legs, or ankles. SEEK IMMEDIATE MEDICAL CARE IF:   You have a fever.  You are leaking fluid from your vagina.  You have spotting or bleeding from your vagina.  You have severe abdominal cramping or pain.  You have rapid weight gain or loss.  You vomit blood or material that looks like coffee grounds.  You are exposed to [place] measles and have never had them.  You are exposed to fifth disease or chickenpox.  You develop a severe headache.  You have shortness of breath.  You have any kind of trauma, such as from a fall or a car accident.   This information is not intended to replace advice given to you by your health care provider. Make sure you discuss any questions you have with your health care provider.   Document Released: 07/27/2001 Document Revised: 08/23/2014 Document Reviewed: 06/12/2013 Elsevier Interactive Patient Education 2016 [employer]  

## 2015-10-02 ENCOUNTER — Ambulatory Visit (INDEPENDENT_AMBULATORY_CARE_PROVIDER_SITE_OTHER): Payer: 59

## 2015-10-02 ENCOUNTER — Ambulatory Visit (INDEPENDENT_AMBULATORY_CARE_PROVIDER_SITE_OTHER): Payer: 59 | Admitting: Obstetrics and Gynecology

## 2015-10-02 VITALS — BP 112/66 | HR 52 | Wt 156.4 lb

## 2015-10-02 DIAGNOSIS — Z1389 Encounter for screening for other disorder: Secondary | ICD-10-CM

## 2015-10-02 DIAGNOSIS — Z113 Encounter for screening for infections with a predominantly sexual mode of transmission: Secondary | ICD-10-CM

## 2015-10-02 DIAGNOSIS — Z3490 Encounter for supervision of normal pregnancy, unspecified, unspecified trimester: Secondary | ICD-10-CM

## 2015-10-02 DIAGNOSIS — N926 Irregular menstruation, unspecified: Secondary | ICD-10-CM

## 2015-10-02 DIAGNOSIS — Z36 Encounter for antenatal screening of mother: Secondary | ICD-10-CM

## 2015-10-02 DIAGNOSIS — Z331 Pregnant state, incidental: Secondary | ICD-10-CM

## 2015-10-02 DIAGNOSIS — Z369 Encounter for antenatal screening, unspecified: Secondary | ICD-10-CM

## 2015-10-02 NOTE — Patient Instructions (Signed)
Minor Illnesses and Medications in Pregnancy  Cold/Flu:  Sudafed for congestion- Robitussin (plain) for cough- Tylenol for discomfort.  Please follow the directions on the label.  Try not to take any more than needed.  OTC Saline nasal spray and air humidifier or cool-mist  Vaporizer to sooth nasal irritation and to loosen congestion.  It is also important to increase intake of non carbonated fluids, especially if you have a fever.  Constipation:  Colace-2 capsules at bedtime; Metamucil- follow directions on label; Senokot- 1 tablet at bedtime.  Any one of these medications can be used.  It is also very important to increase fluids and fruits along with regular exercise.  If problem persists please call the office.  Diarrhea:  Kaopectate as directed on the label.  Eat a bland diet and increase fluids.  Avoid highly seasoned foods.  Headache:  Tylenol 1 or 2 tablets every 3-4 hours as needed  Indigestion:  Maalox, Mylanta, Tums or Rolaids- as directed on label.  Also try to eat small meals and avoid fatty, greasy or spicy foods.  Nausea with or without Vomiting:  Nausea in pregnancy is caused by increased levels of hormones in the body which influence the digestive system and cause irritation when stomach acids accumulate.  Symptoms usually subside after 1st trimester of pregnancy.  Try the following: 1. Keep saltines, graham crackers or dry toast by your bed to eat upon awakening. 2. Don't let your stomach get empty.  Try to eat 5-6 small meals per day instead of 3 large ones. 3. Avoid greasy fatty or highly seasoned foods.  4. Take OTC Unisom 1 tablet at bed time along with OTC Vitamin B6 25-50 mg 3 times per day.    If nausea continues with vomiting and you are unable to keep down food and fluids you may need a prescription medication.  Please notify your provider.   Sore throat:  Chloraseptic spray, throat lozenges and or plain Tylenol.  Vaginal Yeast Infection:  OTC Monistat for 7 days as  directed on label.  If symptoms do not resolve within a week notify provider.  If any of the above problems do not subside with recommended treatment please call the office for further assistance.   Do not take Aspirin, Advil, Motrin or Ibuprofen.  * * OTC= Over the counter Hyperemesis Gravidarum Hyperemesis gravidarum is a severe form of nausea and vomiting that happens during pregnancy. Hyperemesis is worse than morning sickness. It may cause you to have nausea or vomiting all day for many days. It may keep you from eating and drinking enough food and liquids. Hyperemesis usually occurs during the first half (the first 20 weeks) of pregnancy. It often goes away once a woman is in her second half of pregnancy. However, sometimes hyperemesis continues through an entire pregnancy.  CAUSES  The cause of this condition is not completely known but is thought to be related to changes in the body's hormones when pregnant. It could be from the high level of the pregnancy hormone or an increase in estrogen in the body.  SIGNS AND SYMPTOMS  2. Severe nausea and vomiting. 3. Nausea that does not go away. 4. Vomiting that does not allow you to keep any food down. 5. Weight loss and body fluid loss (dehydration). 6. Having no desire to eat or not liking food you have previously enjoyed. DIAGNOSIS  Your health care provider will do a physical exam and ask you about your symptoms. He or she may also  order blood tests and urine tests to make sure something else is not causing the problem.  TREATMENT  You may only need medicine to control the problem. If medicines do not control the nausea and vomiting, you will be treated in the hospital to prevent dehydration, increased acid in the blood (acidosis), weight loss, and changes in the electrolytes in your body that may harm the unborn baby (fetus). You may need IV fluids.  HOME CARE INSTRUCTIONS   Only take over-the-counter or prescription medicines as directed by  your health care provider.  Try eating a couple of dry crackers or toast in the morning before getting out of bed.  Avoid foods and smells that upset your stomach.  Avoid fatty and spicy foods.  Eat 5-6 small meals a day.  Do not drink when eating meals. Drink between meals.  For snacks, eat high-protein foods, such as cheese.  Eat or suck on things that have ginger in them. Ginger helps nausea.  Avoid food preparation. The smell of food can spoil your appetite.  Avoid iron pills and iron in your multivitamins until after 3-4 months of being pregnant. However, consult with your health care provider before stopping any prescribed iron pills. SEEK MEDICAL CARE IF:   Your abdominal pain increases.  You have a severe headache.  You have vision problems.  You are losing weight. SEEK IMMEDIATE MEDICAL CARE IF:   You are unable to keep fluids down.  You vomit blood.  You have constant nausea and vomiting.  You have excessive weakness.  You have extreme thirst.  You have dizziness or fainting.  You have a fever or persistent symptoms for more than 2-3 days.  You have a fever and your symptoms suddenly get worse. MAKE SURE YOU:   Understand these instructions.  Will watch your condition.  Will get help right away if you are not doing well or get worse.   This information is not intended to replace advice given to you by your health care provider. Make sure you discuss any questions you have with your health care provider.   Document Released: 08/02/2005 Document Revised: 05/23/2013 Document Reviewed: 03/14/2013 Elsevier Interactive Patient Education 2016 [employer]. Commonly Asked Questions During Pregnancy  Cats: A parasite can be excreted in cat feces.  To avoid exposure you need to have another person empty the little box.  If you must empty the litter box you will need to wear gloves.  Wash your hands after handling your cat.  This parasite can also be found  in raw or undercooked meat so this should also be avoided.  Colds, Sore Throats, Flu: Please check your medication sheet to see what you can take for symptoms.  If your symptoms are unrelieved by these medications please call the office.  Dental Work: Most any dental work Agricultural consultant recommends is permitted.  X-rays should only be taken during the first trimester if absolutely necessary.  Your abdomen should be shielded with a lead apron during all x-rays.  Please notify your provider prior to receiving any x-rays.  Novocaine is fine; gas is not recommended.  If your dentist requires a note from [place] prior to dental work please call the office and we will provide one for you.  Exercise: Exercise is an important part of staying healthy during your pregnancy.  You may continue most exercises you were accustomed to prior to pregnancy.  Later in your pregnancy you will most likely notice you have difficulty with activities requiring  balance like riding a bicycle.  It is important that you listen to your body and avoid activities that put you at a higher risk of falling.  Adequate rest and staying well hydrated are a must!  If you have questions about the safety of specific activities ask your provider.    Exposure to Children with illness: Try to avoid obvious exposure; report any symptoms to [place] when noted,  If you have chicken pos, red measles or mumps, you should be immune to these diseases.   Please do not take any vaccines while pregnant unless you have checked with your OB provider.  Fetal Movement: After 28 weeks we recommend you do "kick counts" twice daily.  Lie or sit down in a calm quiet environment and count your baby movements "kicks".  You should feel your baby at least 10 times per hour.  If you have not felt 10 kicks within the first hour get up, walk around and have something sweet to eat or drink then repeat for an additional hour.  If count remains less than 10 per hour notify your  provider.  Fumigating: Follow your pest control agent's advice as to how long to stay out of your home.  Ventilate the area well before re-entering.  Hemorrhoids:   Most over-the-counter preparations can be used during pregnancy.  Check your medication to see what is safe to use.  It is important to use a stool softener or fiber in your diet and to drink lots of liquids.  If hemorrhoids seem to be getting worse please call the office.   Hot Tubs:  Hot tubs Jacuzzis and saunas are not recommended while pregnant.  These increase your internal body temperature and should be avoided.  Intercourse:  Sexual intercourse is safe during pregnancy as long as you are comfortable, unless otherwise advised by your provider.  Spotting may occur after intercourse; report any bright red bleeding that is heavier than spotting.  Labor:  If you know that you are in labor, please go to the hospital.  If you are unsure, please call the office and let us help you decide what to do.  Lifting, straining, etc:  If your job requires heavy lifting or straining please check with your provider for any limitations.  Generally, you should not lift items heavier than that you can lift simply with your hands and arms (no back muscles)  Painting:  Paint fumes do not harm your pregnancy, but may make you ill and should be avoided if possible.  Latex or water based paints have less odor than oils.  Use adequate ventilation while painting.  Permanents & Hair Color:  Chemicals in hair dyes are not recommended as they cause increase hair dryness which can increase hair loss during pregnancy.  " Highlighting" and permanents are allowed.  Dye may be absorbed differently and permanents may not hold as well during pregnancy.  Sunbathing:  Use a sunscreen, as skin burns easily during pregnancy.  Drink plenty of fluids; avoid over heating.  Tanning Beds:  Because their possible side effects are still unknown, tanning beds are not  recommended.  Ultrasound Scans:  Routine ultrasounds are performed at approximately 20 weeks.  You will be able to see your baby's general anatomy an if you would like to know the gender this can usually be determined as well.  If it is questionable when you conceived you may also receive an ultrasound early in your pregnancy for dating purposes.  Otherwise ultrasound exams are  not routinely performed unless there is a medical necessity.  Although you can request a scan we ask that you pay for it when conducted because insurance does not cover " patient request" scans.  Work: If your pregnancy proceeds without complications you may work until your due date, unless your physician or employer advises otherwise.  Round Ligament Pain/Pelvic Discomfort:  Sharp, shooting pains not associated with bleeding are fairly common, usually occurring in the second trimester of pregnancy.  They tend to be worse when standing up or when you remain standing for long periods of time.  These are the result of pressure of certain pelvic ligaments called "round ligaments".  Rest, Tylenol and heat seem to be the most effective relief.  As the womb and fetus grow, they rise out of the pelvis and the discomfort improves.  Please notify the office if your pain seems different than that described.  It may represent a more serious condition.

## 2015-10-02 NOTE — Progress Notes (Signed)
   Morgan Pham presents for NOB nurse interview visit. G-2.  P-1101. Ultrasound today. EDD: 05/20/16.  Pregnancy education material explained and given. No cats in the home. NOB labs ordered. HIV labs and Drug screen were explained optional and she could opt out of tests but did not decline. Drug screen ordered. PNV encouraged. NT to discuss with provider. Pt. To follow up with provider in 6 weeks for NOB physical.  All questions answered.  ZIKA EXPOSURE SCREEN:  The patient has not traveled to a [place] Virus endemic area within the past 6 months, nor has she had unprotected sex with a partner who has travelled to a [place] endemic region within the past 6 months. The patient has been advised to notify us if these factors change any time during this current pregnancy, so adequate testing and monitoring can be initiated.

## 2015-10-03 ENCOUNTER — Other Ambulatory Visit: Payer: Self-pay | Admitting: Obstetrics and Gynecology

## 2015-10-03 DIAGNOSIS — Z283 Underimmunization status: Secondary | ICD-10-CM | POA: Insufficient documentation

## 2015-10-03 DIAGNOSIS — O09899 Supervision of other high risk pregnancies, unspecified trimester: Secondary | ICD-10-CM

## 2015-10-03 DIAGNOSIS — Z2839 Other underimmunization status: Secondary | ICD-10-CM | POA: Insufficient documentation

## 2015-10-03 DIAGNOSIS — O9989 Other specified diseases and conditions complicating pregnancy, childbirth and the puerperium: Principal | ICD-10-CM

## 2015-10-03 LAB — GC/CHLAMYDIA PROBE AMP
CHLAMYDIA, DNA PROBE: NEGATIVE
Neisseria gonorrhoeae by PCR: NEGATIVE

## 2015-10-03 LAB — URINALYSIS, ROUTINE W REFLEX MICROSCOPIC
Bilirubin, UA: NEGATIVE
GLUCOSE, UA: NEGATIVE
Ketones, UA: NEGATIVE
Leukocytes, UA: NEGATIVE
Nitrite, UA: NEGATIVE
PROTEIN UA: NEGATIVE
RBC, UA: NEGATIVE
Specific Gravity, UA: 1.015 (ref 1.005–1.030)
Urobilinogen, Ur: 0.2 mg/dL (ref 0.2–1.0)
pH, UA: 6.5 (ref 5.0–7.5)

## 2015-10-03 LAB — PAIN MGT SCRN (14 DRUGS), UR
Amphetamine Screen, Ur: NEGATIVE ng/mL
BUPRENORPHINE, URINE: NEGATIVE ng/mL
Barbiturate Screen, Ur: NEGATIVE ng/mL
Benzodiazepine Screen, Urine: NEGATIVE ng/mL
Cannabinoids Ur Ql Scn: NEGATIVE ng/mL
Cocaine(Metab.)Screen, Urine: NEGATIVE ng/mL
Creatinine(Crt), U: 107.7 mg/dL (ref 20.0–300.0)
Fentanyl, Urine: NEGATIVE pg/mL
METHADONE SCREEN, URINE: NEGATIVE ng/mL
Meperidine Screen, Urine: NEGATIVE ng/mL
Opiate Scrn, Ur: NEGATIVE ng/mL
Oxycodone+Oxymorphone Ur Ql Scn: NEGATIVE ng/mL
PCP SCRN UR: NEGATIVE ng/mL
PH UR, DRUG SCRN: 6.3 (ref 4.5–8.9)
Propoxyphene, Screen: NEGATIVE ng/mL
TRAMADOL UR QL SCN: NEGATIVE ng/mL

## 2015-10-03 LAB — CBC WITH DIFFERENTIAL/PLATELET
BASOS: 0 %
Basophils Absolute: 0 10*3/uL (ref 0.0–0.2)
EOS (ABSOLUTE): 0.1 10*3/uL (ref 0.0–0.4)
EOS: 2 %
Hematocrit: 42.9 % (ref 34.0–46.6)
Hemoglobin: 14.1 g/dL (ref 11.1–15.9)
IMMATURE GRANULOCYTES: 0 %
Immature Grans (Abs): 0 10*3/uL (ref 0.0–0.1)
Lymphocytes Absolute: 2 10*3/uL (ref 0.7–3.1)
Lymphs: 30 %
MCH: 28.4 pg (ref 26.6–33.0)
MCHC: 32.9 g/dL (ref 31.5–35.7)
MCV: 86 fL (ref 79–97)
MONOS ABS: 0.3 10*3/uL (ref 0.1–0.9)
Monocytes: 5 %
NEUTROS ABS: 4.1 10*3/uL (ref 1.4–7.0)
NEUTROS PCT: 63 %
PLATELETS: 230 10*3/uL (ref 150–379)
RBC: 4.97 x10E6/uL (ref 3.77–5.28)
RDW: 12.9 % (ref 12.3–15.4)
WBC: 6.6 10*3/uL (ref 3.4–10.8)

## 2015-10-03 LAB — ANTIBODY SCREEN: Antibody Screen: NEGATIVE

## 2015-10-03 LAB — HIV ANTIBODY (ROUTINE TESTING W REFLEX): HIV SCREEN 4TH GENERATION: NONREACTIVE

## 2015-10-03 LAB — HEPATITIS B SURFACE ANTIGEN: HEP B S AG: NEGATIVE

## 2015-10-03 LAB — RH TYPE: RH TYPE: POSITIVE

## 2015-10-03 LAB — NICOTINE SCREEN, URINE: Cotinine Ql Scrn, Ur: NEGATIVE ng/mL

## 2015-10-03 LAB — RUBELLA ANTIBODY, IGM

## 2015-10-03 LAB — RPR: RPR: NONREACTIVE

## 2015-10-03 LAB — VARICELLA ZOSTER ANTIBODY, IGM: Varicella IgM: <0.91 index (ref 0.00–0.90)

## 2015-10-03 LAB — ABO

## 2015-10-04 ENCOUNTER — Other Ambulatory Visit: Payer: Self-pay | Admitting: Obstetrics and Gynecology

## 2015-10-04 DIAGNOSIS — Z2839 Other underimmunization status: Secondary | ICD-10-CM

## 2015-10-04 DIAGNOSIS — Z283 Underimmunization status: Principal | ICD-10-CM

## 2015-10-04 DIAGNOSIS — O09899 Supervision of other high risk pregnancies, unspecified trimester: Secondary | ICD-10-CM

## 2015-10-04 LAB — URINE CULTURE, OB REFLEX: Organism ID, Bacteria: NO GROWTH

## 2015-10-04 LAB — CULTURE, OB URINE

## 2015-10-30 ENCOUNTER — Encounter: Payer: 59 | Admitting: Obstetrics and Gynecology

## 2015-11-06 ENCOUNTER — Ambulatory Visit (INDEPENDENT_AMBULATORY_CARE_PROVIDER_SITE_OTHER): Payer: 59 | Admitting: Obstetrics and Gynecology

## 2015-11-06 ENCOUNTER — Encounter: Payer: Self-pay | Admitting: Obstetrics and Gynecology

## 2015-11-06 VITALS — BP 118/68 | HR 78 | Wt 158.0 lb

## 2015-11-06 DIAGNOSIS — Z331 Pregnant state, incidental: Secondary | ICD-10-CM

## 2015-11-06 LAB — POCT URINALYSIS DIPSTICK
Bilirubin, UA: NEGATIVE
Blood, UA: NEGATIVE
Glucose, UA: NEGATIVE
KETONES UA: NEGATIVE
LEUKOCYTES UA: NEGATIVE
NITRITE UA: NEGATIVE
PH UA: 6
PROTEIN UA: NEGATIVE
Spec Grav, UA: 1.01
UROBILINOGEN UA: 0.2

## 2015-11-06 NOTE — Progress Notes (Signed)
ROB-pt denies any complaints, she is doing well

## 2015-11-06 NOTE — Progress Notes (Signed)
NEW OB HISTORY AND PHYSICAL  SUBJECTIVE:       Morgan Pham is a 40 y.o. [id number] female, Patient's last menstrual period was 08/14/2015., Estimated Date of Delivery: 05/20/16, [redacted]w[redacted]d, presents today for establishment of Prenatal Care. She has no unusual complaints and complains of fatigue      Gynecologic History Patient's last menstrual period was 08/14/2015. Unknown Contraception: none Last Pap: 2015. Results were: normal  Obstetric History OB History  Gravida Para Term Preterm AB SAB TAB Ectopic Multiple Living  # Outcome Date GA Lbr Len/2nd Weight Sex Delivery Anes PTL Lv  2 Current           1 Preterm 05/2006 [redacted]w[redacted]d  5 lb 9 oz (2.523 kg) F Vag-Spont  N Y      Past Medical History  Diagnosis Date  . Asthma     childhood  . Allergy     Past Surgical History  Procedure Laterality Date  . Dilation and curettage of uterus      poc x2  . Leep      [name] has been getting pap's now yearly    Current Outpatient Prescriptions on File Prior to Visit  Medication Sig Dispense Refill  . Prenatal Vit-Fe Fumarate-FA (PRENATAL MULTIVITAMIN) TABS tablet Take 1 tablet by mouth daily at 12 noon.     No current facility-administered medications on file prior to visit.    Allergies  Allergen Reactions  . Shellfish Allergy   . Penicillins Rash  . Sulfa Antibiotics Rash    Social History   Social History  . Marital Status: Married    Spouse Name: N/A  . Number of Children: N/A  . Years of Education: N/A   Occupational History  . paralegal    Social History Main Topics  . Smoking status: Never Smoker   . Smokeless tobacco: Never Used  . Alcohol Use: No     Comment: occasionally  . Drug Use: No  . Sexual Activity:    Partners: Male    Birth Control/ Protection: None   Other Topics Concern  . Not on file   Social History Narrative    Family History  Problem Relation Age of Onset  . Asthma Father   . Asthma Brother   . Asthma  Daughter   . Heart failure Father     heart attack  . Hyperlipidemia Father   . Migraines Mother   . Rashes / Skin problems Daughter     The following portions of the patient's history were reviewed and updated as appropriate: allergies, current medications, past OB history, past medical history, past surgical history, past family history, past social history, and problem list.    OBJECTIVE: Initial Physical Exam (New OB)  GENERAL APPEARANCE: alert, well appearing, in no apparent distress, oriented to person, place and time HEAD: normocephalic, atraumatic MOUTH: mucous membranes moist, pharynx normal without lesions THYROID: no thyromegaly or masses present BREASTS: not examined LUNGS: clear to auscultation, no wheezes, rales or rhonchi, symmetric air entry HEART: regular rate and rhythm, no murmurs ABDOMEN: soft, nontender, nondistended, no abnormal masses, no epigastric pain, fundus not palpable and FHT present EXTREMITIES: no redness or tenderness in the calves or thighs SKIN: normal coloration and turgor, no rashes LYMPH NODES: no adenopathy palpable NEUROLOGIC: alert, oriented, normal speech, no focal findings or movement disorder noted  PELVIC EXAM not indicated.  ASSESSMENT: Normal pregnancy AMA PLAN: Prenatal care Desires genetic  screening See orders

## 2015-11-06 NOTE — Patient Instructions (Signed)

## 2015-11-14 ENCOUNTER — Encounter: Payer: Self-pay | Admitting: Obstetrics and Gynecology

## 2015-11-18 ENCOUNTER — Other Ambulatory Visit: Payer: Self-pay | Admitting: Obstetrics and Gynecology

## 2015-11-20 ENCOUNTER — Encounter: Payer: Self-pay | Admitting: Obstetrics and Gynecology

## 2015-12-05 ENCOUNTER — Encounter: Payer: Self-pay | Admitting: Obstetrics and Gynecology

## 2015-12-05 ENCOUNTER — Ambulatory Visit (INDEPENDENT_AMBULATORY_CARE_PROVIDER_SITE_OTHER): Payer: 59 | Admitting: Obstetrics and Gynecology

## 2015-12-05 VITALS — BP 117/76 | HR 100 | Wt 161.9 lb

## 2015-12-05 DIAGNOSIS — Z331 Pregnant state, incidental: Secondary | ICD-10-CM

## 2015-12-05 LAB — POCT URINALYSIS DIPSTICK
BILIRUBIN UA: NEGATIVE
Blood, UA: NEGATIVE
GLUCOSE UA: NEGATIVE
Ketones, UA: NEGATIVE
LEUKOCYTES UA: NEGATIVE
NITRITE UA: NEGATIVE
PH UA: 6
Protein, UA: NEGATIVE
Spec Grav, UA: 1.015
Urobilinogen, UA: 0.2

## 2015-12-05 NOTE — Progress Notes (Signed)
ROB-pt is c/o allergies

## 2015-12-25 ENCOUNTER — Ambulatory Visit (INDEPENDENT_AMBULATORY_CARE_PROVIDER_SITE_OTHER): Payer: 59 | Admitting: Obstetrics and Gynecology

## 2015-12-25 ENCOUNTER — Encounter: Payer: Self-pay | Admitting: Obstetrics and Gynecology

## 2015-12-25 ENCOUNTER — Ambulatory Visit (INDEPENDENT_AMBULATORY_CARE_PROVIDER_SITE_OTHER): Payer: 59

## 2015-12-25 VITALS — BP 119/75 | HR 94 | Wt 166.0 lb

## 2015-12-25 DIAGNOSIS — Z3482 Encounter for supervision of other normal pregnancy, second trimester: Secondary | ICD-10-CM

## 2015-12-25 DIAGNOSIS — Z331 Pregnant state, incidental: Secondary | ICD-10-CM

## 2015-12-25 LAB — POCT URINALYSIS DIPSTICK
Bilirubin, UA: NEGATIVE
Blood, UA: NEGATIVE
GLUCOSE UA: NEGATIVE
KETONES UA: NEGATIVE
Leukocytes, UA: NEGATIVE
Nitrite, UA: NEGATIVE
Protein, UA: NEGATIVE
SPEC GRAV UA: 1.015
Urobilinogen, UA: 0.2
pH, UA: 6

## 2015-12-25 NOTE — Progress Notes (Signed)
ROB- anatomy scan done today, pt denies any complaints Pt wants to use castor oil and coconut oil for her scalp

## 2015-12-25 NOTE — Progress Notes (Signed)
ROB & Anatomy scan- Indications:Anatomy U/S Findings:  Singleton intrauterine pregnancy is visualized with FHR at 165 BPM. Biometrics give an (U/S) Gestational age of [redacted] weeks and an (U/S) EDD of 05/20/16; this correlates with the clinically established EDD of 05/20/16.  Fetal presentation is Breech.  EFW: 273g (10 oz). Placenta: Anterior, grade 0, 6.8cm from internal os. AFI: subjective-normal.  Anatomic survey is complete and normal; Gender - female  .   Ovaries not visualized. Survey of the adnexa demonstrates no adnexal masses. There is no free peritoneal fluid in the cul de sac.  Impression: 1. 19 week Viable Singleton Intrauterine pregnancy by U/S. 2. (U/S) EDD is consistent with Clinically established (LMP) EDD of 05/20/16. 3. Normal Anatomy Scan

## 2015-12-31 ENCOUNTER — Encounter: Payer: Self-pay | Admitting: Obstetrics and Gynecology

## 2016-01-08 ENCOUNTER — Encounter: Payer: Self-pay | Admitting: Obstetrics and Gynecology

## 2016-01-21 ENCOUNTER — Telehealth: Payer: Self-pay | Admitting: Obstetrics and Gynecology

## 2016-01-21 ENCOUNTER — Encounter: Payer: 59 | Admitting: Obstetrics and Gynecology

## 2016-01-21 NOTE — Telephone Encounter (Signed)
Melody told her to take Biotin and she wanted to know what dosage she needs to take for hair loss

## 2016-01-22 NOTE — Telephone Encounter (Signed)
500mg

## 2016-01-23 ENCOUNTER — Encounter: Payer: Self-pay | Admitting: Obstetrics and Gynecology

## 2016-01-23 ENCOUNTER — Ambulatory Visit (INDEPENDENT_AMBULATORY_CARE_PROVIDER_SITE_OTHER): Payer: 59 | Admitting: Obstetrics and Gynecology

## 2016-01-23 VITALS — BP 109/71 | HR 81 | Wt 170.2 lb

## 2016-01-23 DIAGNOSIS — Z3402 Encounter for supervision of normal first pregnancy, second trimester: Secondary | ICD-10-CM

## 2016-01-23 LAB — POCT URINALYSIS DIPSTICK
Bilirubin, UA: NEGATIVE
Glucose, UA: NEGATIVE
Ketones, UA: NEGATIVE
Leukocytes, UA: NEGATIVE
Nitrite, UA: NEGATIVE
PROTEIN UA: NEGATIVE
RBC UA: NEGATIVE
UROBILINOGEN UA: 0.2
pH, UA: 7

## 2016-01-23 NOTE — Progress Notes (Signed)
ROB- at times having low back pain and lower abd pain.

## 2016-01-23 NOTE — Patient Instructions (Signed)

## 2016-01-23 NOTE — Telephone Encounter (Signed)
Pt informed

## 2016-01-23 NOTE — Progress Notes (Signed)
ROB- doing well except low back pains; did also develop a hemorrhoid- recommend OTC Prep H

## 2016-01-27 ENCOUNTER — Encounter: Payer: Self-pay | Admitting: Obstetrics and Gynecology

## 2016-01-27 ENCOUNTER — Telehealth: Payer: Self-pay | Admitting: Obstetrics and Gynecology

## 2016-01-27 NOTE — Telephone Encounter (Signed)
Pt states last night when she wiped she noted dark brown blood and put a panti liner without any further dark brown blood noted. No severe pain. No bright red blood. Not sexually active recently. Feels like she did on her visit with [facility] on Friday. Notes fatigue. Pt will remain at home today and rest. Will contact office if any changes.

## 2016-01-27 NOTE — Telephone Encounter (Signed)
Patient is [id number], she called the after hours nurse stating she is bleeding dark brown blood but not having any pain. They advised her to go to L&D but she did not go,she chose to wait to call us this AM. Please Advise

## 2016-01-28 ENCOUNTER — Ambulatory Visit (INDEPENDENT_AMBULATORY_CARE_PROVIDER_SITE_OTHER): Payer: 59 | Admitting: Obstetrics and Gynecology

## 2016-01-28 VITALS — BP 109/63 | HR 81 | Wt 169.9 lb

## 2016-01-28 DIAGNOSIS — Z3482 Encounter for supervision of other normal pregnancy, second trimester: Secondary | ICD-10-CM | POA: Diagnosis not present

## 2016-01-28 DIAGNOSIS — O4690 Antepartum hemorrhage, unspecified, unspecified trimester: Secondary | ICD-10-CM

## 2016-01-28 DIAGNOSIS — Z3492 Encounter for supervision of normal pregnancy, unspecified, second trimester: Secondary | ICD-10-CM

## 2016-01-28 LAB — POCT URINALYSIS DIPSTICK
BILIRUBIN UA: NEGATIVE
GLUCOSE UA: NEGATIVE
Ketones, UA: NEGATIVE
Leukocytes, UA: NEGATIVE
NITRITE UA: NEGATIVE
Protein, UA: NEGATIVE
Spec Grav, UA: <=1.005
Urobilinogen, UA: NEGATIVE
pH, UA: 7.5

## 2016-01-31 NOTE — Progress Notes (Signed)
Problem OB: Patient c/o vaginal bleeding (dark brown blood x 2 days), light.  Denies any cramping. Denies recent intercourse, UTI symptoms.  Pelvic exam with scant brown discharge, cervix closed. Reviewed ultrasound results, no evidence of previa. UA wnl. Wet prep neg except RBCs. Given reassurance. Advised pelvic rest x 1-2 weeks, inform office of any heavier bleeding.

## 2016-02-04 ENCOUNTER — Telehealth: Payer: Self-pay | Admitting: Obstetrics and Gynecology

## 2016-02-04 NOTE — Telephone Encounter (Signed)
Patient called requesting a refill on deodorant. [facility] uses the [facility] [address].Thanks

## 2016-02-05 ENCOUNTER — Encounter: Payer: 59 | Admitting: Obstetrics and Gynecology

## 2016-02-06 ENCOUNTER — Other Ambulatory Visit: Payer: Self-pay | Admitting: *Deleted

## 2016-02-06 ENCOUNTER — Other Ambulatory Visit: Payer: Self-pay | Admitting: Obstetrics and Gynecology

## 2016-02-06 MED ORDER — ALUMINUM CHLORIDE 20 % EX SOLN: Freq: Every day | CUTANEOUS | Status: DC

## 2016-02-06 NOTE — Telephone Encounter (Signed)
She will have to have pharmacy send a refill request as I dont have it in the system, but I am happy to refill it

## 2016-02-06 NOTE — Telephone Encounter (Signed)
Done-ac 

## 2016-02-18 ENCOUNTER — Ambulatory Visit (INDEPENDENT_AMBULATORY_CARE_PROVIDER_SITE_OTHER): Payer: 59 | Admitting: Obstetrics and Gynecology

## 2016-02-18 ENCOUNTER — Encounter: Payer: Self-pay | Admitting: Obstetrics and Gynecology

## 2016-02-18 VITALS — BP 112/65 | HR 85 | Wt 172.9 lb

## 2016-02-18 DIAGNOSIS — Z3493 Encounter for supervision of normal pregnancy, unspecified, third trimester: Secondary | ICD-10-CM

## 2016-02-18 LAB — POCT URINALYSIS DIPSTICK
Bilirubin, UA: NEGATIVE
Glucose, UA: NEGATIVE
KETONES UA: NEGATIVE
Leukocytes, UA: NEGATIVE
Nitrite, UA: NEGATIVE
PROTEIN UA: NEGATIVE
RBC UA: NEGATIVE
UROBILINOGEN UA: 0.2
pH, UA: 7

## 2016-02-18 NOTE — Progress Notes (Signed)
ROB- denies any more spotting; glucola next visit; to increase water intake

## 2016-02-18 NOTE — Progress Notes (Signed)
ROB- pt is doing well, no new complaints

## 2016-03-05 ENCOUNTER — Encounter: Payer: Self-pay | Admitting: Obstetrics and Gynecology

## 2016-03-05 ENCOUNTER — Ambulatory Visit (INDEPENDENT_AMBULATORY_CARE_PROVIDER_SITE_OTHER): Payer: 59 | Admitting: Obstetrics and Gynecology

## 2016-03-05 VITALS — BP 107/70 | HR 84 | Wt 175.1 lb

## 2016-03-05 DIAGNOSIS — Z23 Encounter for immunization: Secondary | ICD-10-CM

## 2016-03-05 DIAGNOSIS — Z131 Encounter for screening for diabetes mellitus: Secondary | ICD-10-CM

## 2016-03-05 DIAGNOSIS — Z1389 Encounter for screening for other disorder: Secondary | ICD-10-CM

## 2016-03-05 DIAGNOSIS — Z36 Encounter for antenatal screening of mother: Secondary | ICD-10-CM | POA: Diagnosis not present

## 2016-03-05 DIAGNOSIS — Z369 Encounter for antenatal screening, unspecified: Secondary | ICD-10-CM

## 2016-03-05 LAB — POCT URINALYSIS DIPSTICK
Bilirubin, UA: NEGATIVE
GLUCOSE UA: NEGATIVE
Ketones, UA: NEGATIVE
NITRITE UA: NEGATIVE
Protein, UA: NEGATIVE
Spec Grav, UA: 1.02
UROBILINOGEN UA: NEGATIVE
pH, UA: 6

## 2016-03-05 MED ORDER — TETANUS-DIPHTH-ACELL PERTUSSIS 5-2.5-18.5 LF-MCG/0.5 IM SUSP
0.5000 mL | Freq: Once | INTRAMUSCULAR | Status: AC
  Administered 2016-03-05: 0.5 mL via INTRAMUSCULAR

## 2016-03-05 NOTE — Patient Instructions (Addendum)
Third Trimester of Pregnancy The third trimester is from week 29 through week 42, months 7 through 9. The third trimester is a time when the fetus is growing rapidly. At the end of the ninth month, the fetus is about 20 inches in length and weighs 6-10 pounds.  BODY CHANGES Your body goes through many changes during pregnancy. The changes vary from woman to woman.   Your weight will continue to increase. You can expect to gain 25-35 pounds (11-16 kg) by the end of the pregnancy.  You may begin to get stretch marks on your hips, abdomen, and breasts.  You may urinate more often because the fetus is moving lower into your pelvis and pressing on your bladder.  You may develop or continue to have heartburn as a result of your pregnancy.  You may develop constipation because certain hormones are causing the muscles that push waste through your intestines to slow down.  You may develop hemorrhoids or swollen, bulging veins (varicose veins).  You may have pelvic pain because of the weight gain and pregnancy hormones relaxing your joints between the bones in your pelvis. Backaches may result from overexertion of the muscles supporting your posture.  You may have changes in your hair. These can include thickening of your hair, rapid growth, and changes in texture. Some women also have hair loss during or after pregnancy, or hair that feels dry or thin. Your hair will most likely return to normal after your baby is born.  Your breasts will continue to grow and be tender. A yellow discharge may leak from your breasts called colostrum.  Your belly button may stick out.  You may feel short of breath because of your expanding uterus.  You may notice the fetus "dropping," or moving lower in your abdomen.  You may have a bloody mucus discharge. This usually occurs a few days to a week before labor begins.  Your cervix becomes thin and soft (effaced) near your due date. WHAT TO EXPECT AT YOUR PRENATAL  EXAMS  You will have prenatal exams every 2 weeks until week 36. Then, you will have weekly prenatal exams. During a routine prenatal visit:  You will be weighed to make sure you and the fetus are growing normally.  Your blood pressure is taken.  Your abdomen will be measured to track your baby's growth.  The fetal heartbeat will be listened to.  Any test results from the previous visit will be discussed.  You may have a cervical check near your due date to see if you have effaced. At around 36 weeks, your caregiver will check your cervix. At the same time, your caregiver will also perform a test on the secretions of the vaginal tissue. This test is to determine if a type of bacteria, Group B streptococcus, is present. Your caregiver will explain this further. Your caregiver may ask you:  What your birth plan is.  How you are feeling.  If you are feeling the baby move.  If you have had any abnormal symptoms, such as leaking fluid, bleeding, severe headaches, or abdominal cramping.  If you are using any tobacco products, including cigarettes, chewing tobacco, and electronic cigarettes.  If you have any questions. Other tests or screenings that may be performed during your third trimester include:  Blood tests that check for low iron levels (anemia).  Fetal testing to check the health, activity level, and growth of the fetus. Testing is done if you have certain medical conditions or if   there are problems during the pregnancy.  HIV (human immunodeficiency virus) testing. If you are at high risk, you may be screened for HIV during your third trimester of pregnancy. FALSE LABOR You may feel small, irregular contractions that eventually go away. These are called Braxton Hicks contractions, or false labor. Contractions may last for hours, days, or even weeks before true labor sets in. If contractions come at regular intervals, intensify, or become painful, it is best to be seen by your  caregiver.  SIGNS OF LABOR   Menstrual-like cramps.  Contractions that are 5 minutes apart or less.  Contractions that start on the top of the uterus and spread down to the lower abdomen and back.  A sense of increased pelvic pressure or back pain.  A watery or bloody mucus discharge that comes from the vagina. If you have any of these signs before the 37th week of pregnancy, call your caregiver right away. You need to go to the hospital to get checked immediately. HOME CARE INSTRUCTIONS   Avoid all smoking, herbs, alcohol, and unprescribed drugs. These chemicals affect the formation and growth of the baby.  Do not use any tobacco products, including cigarettes, chewing tobacco, and electronic cigarettes. If you need help quitting, ask your health care provider. You may receive counseling support and other resources to help you quit.  Follow your caregiver's instructions regarding medicine use. There are medicines that are either safe or unsafe to take during pregnancy.  Exercise only as directed by your caregiver. Experiencing uterine cramps is a good sign to stop exercising.  Continue to eat regular, healthy meals.  Wear a good support bra for breast tenderness.  Do not use hot tubs, steam rooms, or saunas.  Wear your seat belt at all times when driving.  Avoid raw meat, uncooked cheese, cat litter boxes, and soil used by cats. These carry germs that can cause birth defects in the baby.  Take your prenatal vitamins.  Take 1500-2000 mg of calcium daily starting at the 20th week of pregnancy until you deliver your baby.  Try taking a stool softener (if your caregiver approves) if you develop constipation. Eat more high-fiber foods, such as fresh vegetables or fruit and whole grains. Drink plenty of fluids to keep your urine clear or pale yellow.  Take warm sitz baths to soothe any pain or discomfort caused by hemorrhoids. Use hemorrhoid cream if your caregiver approves.  If  you develop varicose veins, wear support hose. Elevate your feet for 15 minutes, 3-4 times a day. Limit salt in your diet.  Avoid heavy lifting, wear low heal shoes, and practice good posture.  Rest a lot with your legs elevated if you have leg cramps or low back pain.  Visit your dentist if you have not gone during your pregnancy. Use a soft toothbrush to brush your teeth and be gentle when you floss.  A sexual relationship may be continued unless your caregiver directs you otherwise.  Do not travel far distances unless it is absolutely necessary and only with the approval of your caregiver.  Take prenatal classes to understand, practice, and ask questions about the labor and delivery.  Make a trial run to the hospital.  Pack your hospital bag.  Prepare the baby's nursery.  Continue to go to all your prenatal visits as directed by your caregiver. SEEK MEDICAL CARE IF:  You are unsure if you are in labor or if your water has broken.  You have dizziness.  You have   mild pelvic cramps, pelvic pressure, or nagging pain in your abdominal area.  You have persistent nausea, vomiting, or diarrhea.  You have a bad smelling vaginal discharge.  You have pain with urination. SEEK IMMEDIATE MEDICAL CARE IF:   You have a fever.  You are leaking fluid from your vagina.  You have spotting or bleeding from your vagina.  You have severe abdominal cramping or pain.  You have rapid weight loss or gain.  You have shortness of breath with chest pain.  You notice sudden or extreme swelling of your face, hands, ankles, feet, or legs.  You have not felt your baby move in over an hour.  You have severe headaches that do not go away with medicine.  You have vision changes.   This information is not intended to replace advice given to you by your health care provider. Make sure you discuss any questions you have with your health care provider.   Document Released: 07/27/2001 Document  Revised: 08/23/2014 Document Reviewed: 10/03/2012 Elsevier Interactive Patient Education 2016 [employer] Inc[employer]  Tdap Vaccine (Tetanus, Diphtheria and Pertussis): What You Need to Know 1. Why get vaccinated? Tetanus, diphtheria and pertussis are very serious diseases. Tdap vaccine can protect us from these diseases. And, Tdap vaccine given to pregnant women can protect newborn babies against pertussis. TETANUS (Lockjaw) is rare in the [place] today. It causes painful muscle tightening and stiffness, usually all over the body.  It can lead to tightening of muscles in the head and neck so you can't open your mouth, swallow, or sometimes even breathe. Tetanus kills about 1 out of 10 people who are infected even after receiving the best medical care. DIPHTHERIA is also rare in the [place] today. It can cause a thick coating to form in the back of the throat.  It can lead to breathing problems, heart failure, paralysis, and death. PERTUSSIS (Whooping Cough) causes severe coughing spells, which can cause difficulty breathing, vomiting and disturbed sleep.  It can also lead to weight loss, incontinence, and rib fractures. Up to 2 in 100 adolescents and 5 in 100 adults with pertussis are hospitalized or have complications, which could include pneumonia or death. These diseases are caused by bacteria. Diphtheria and pertussis are spread from person to person through secretions from coughing or sneezing. Tetanus enters the body through cuts, scratches, or wounds. Before vaccines, as many as 200,000 cases of diphtheria, 200,000 cases of pertussis, and hundreds of cases of tetanus, were reported in the [place] each year. Since vaccination began, reports of cases for tetanus and diphtheria have dropped by about 99% and for pertussis by about 80%. 2. Tdap vaccine Tdap vaccine can protect adolescents and adults from tetanus, diphtheria, and pertussis. One dose of Tdap is routinely given at age 11  or 12. People who did not get Tdap at that age should get it as soon as possible. Tdap is especially important for healthcare professionals and anyone having close contact with a baby younger than 12 months. Pregnant women should get a dose of Tdap during every pregnancy, to protect the newborn from pertussis. Infants are most at risk for severe, life-threatening complications from pertussis. Another vaccine, called Td, protects against tetanus and diphtheria, but not pertussis. A Td booster should be given every 10 years. Tdap may be given as one of these boosters if you have never gotten Tdap before. Tdap may also be given after a severe cut or burn to prevent tetanus infection. Your doctor or   the person giving you the vaccine can give you more information. Tdap may safely be given at the same time as other vaccines. 3. Some people should not get this vaccine  A person who has ever had a life-threatening allergic reaction after a previous dose of any diphtheria, tetanus or pertussis containing vaccine, OR has a severe allergy to any part of this vaccine, should not get Tdap vaccine. Tell the person giving the vaccine about any severe allergies.  Anyone who had coma or long repeated seizures within 7 days after a childhood dose of DTP or DTaP, or a previous dose of Tdap, should not get Tdap, unless a cause other than the vaccine was found. They can still get Td.  Talk to your doctor if you:  have seizures or another nervous system problem,  had severe pain or swelling after any vaccine containing diphtheria, tetanus or pertussis,  ever had a condition called Guillain-Barr Syndrome (GBS),  aren't feeling well on the day the shot is scheduled. 4. Risks With any medicine, including vaccines, there is a chance of side effects. These are usually mild and go away on their own. Serious reactions are also possible but are rare. Most people who get Tdap vaccine do not have any problems with it. Mild  problems following Tdap (Did not interfere with activities)  Pain where the shot was given (about 3 in 4 adolescents or 2 in 3 adults)  Redness or swelling where the shot was given (about 1 person in 5)  Mild fever of at least 100.4F (up to about 1 in 25 adolescents or 1 in 100 adults)  Headache (about 3 or 4 people in 10)  Tiredness (about 1 person in 3 or 4)  Nausea, vomiting, diarrhea, stomach ache (up to 1 in 4 adolescents or 1 in 10 adults)  Chills, sore joints (about 1 person in 10)  Body aches (about 1 person in 3 or 4)  Rash, swollen glands (uncommon) Moderate problems following Tdap (Interfered with activities, but did not require medical attention)  Pain where the shot was given (up to 1 in 5 or 6)  Redness or swelling where the shot was given (up to about 1 in 16 adolescents or 1 in 12 adults)  Fever over 102F (about 1 in 100 adolescents or 1 in 250 adults)  Headache (about 1 in 7 adolescents or 1 in 10 adults)  Nausea, vomiting, diarrhea, stomach ache (up to 1 or 3 people in 100)  Swelling of the entire arm where the shot was given (up to about 1 in 500). Severe problems following Tdap (Unable to perform usual activities; required medical attention)  Swelling, severe pain, bleeding and redness in the arm where the shot was given (rare). Problems that could happen after any vaccine:  People sometimes faint after a medical procedure, including vaccination. Sitting or lying down for about 15 minutes can help prevent fainting, and injuries caused by a fall. Tell your doctor if you feel dizzy, or have vision changes or ringing in the ears.  Some people get severe pain in the shoulder and have difficulty moving the arm where a shot was given. This happens very rarely.  Any medication can cause a severe allergic reaction. Such reactions from a vaccine are very rare, estimated at fewer than 1 in a million doses, and would happen within a few minutes to a few hours  after the vaccination. As with any medicine, there is a very remote chance of a vaccine causing   a serious injury or death. The safety of vaccines is always being monitored. For more information, visit: www.cdc.gov/vaccinesafety/ 5. What if there is a serious problem? What should I look for?  Look for anything that concerns you, such as signs of a severe allergic reaction, very high fever, or unusual behavior.  Signs of a severe allergic reaction can include hives, swelling of the face and throat, difficulty breathing, a fast heartbeat, dizziness, and weakness. These would usually start a few minutes to a few hours after the vaccination. What should I do?  If you think it is a severe allergic reaction or other emergency that can't wait, call 9-1-1 or get the person to the nearest hospital. Otherwise, call your doctor.  Afterward, the reaction should be reported to the Vaccine Adverse Event Reporting System (VAERS). Your doctor might file this report, or you can do it yourself through the VAERS web site at www.vaers.hhs.gov, or by calling 1-800-822-7967. VAERS does not give medical advice.  6. The National Vaccine Injury Compensation Program The National Vaccine Injury Compensation Program (VICP) is a federal program that was created to compensate people who may have been injured by certain vaccines. Persons who believe they may have been injured by a vaccine can learn about the program and about filing a claim by calling 1-800-338-2382 or visiting the VICP website at www.hrsa.gov/vaccinecompensation. There is a time limit to file a claim for compensation. 7. How can I learn more?  Ask your doctor. He or she can give you the vaccine package insert or suggest other sources of information.  Call your local or state health department.  Contact the Centers for Disease Control and Prevention (CDC):  Call 1-800-232-4636 (1-800-CDC-INFO) or  Visit CDC's website at www.cdc.gov/vaccines CDC Tdap  Vaccine VIS (10/09/13)   This information is not intended to replace advice given to you by your health care provider. Make sure you discuss any questions you have with your health care provider.   Document Released: 02/01/2012 Document Revised: 08/23/2014 Document Reviewed: 11/14/2013 Elsevier Interactive Patient Education 2016 Elsevier Inc.   

## 2016-03-05 NOTE — Progress Notes (Signed)
ROB- doing well, TDAP given, declined blood consent form,

## 2016-03-06 LAB — GLUCOSE, 1 HOUR GESTATIONAL: Gestational Diabetes Screen: 149 mg/dL — ABNORMAL HIGH (ref 65–139)

## 2016-03-06 LAB — HEMOGLOBIN AND HEMATOCRIT, BLOOD
Hematocrit: 35.8 % (ref 34.0–46.6)
Hemoglobin: 11.7 g/dL (ref 11.1–15.9)

## 2016-03-10 ENCOUNTER — Other Ambulatory Visit: Payer: 59

## 2016-03-10 DIAGNOSIS — R7309 Other abnormal glucose: Secondary | ICD-10-CM

## 2016-03-11 LAB — GESTATIONAL GLUCOSE TOLERANCE
GLUCOSE 2 HOUR GTT: 139 mg/dL (ref 65–154)
GLUCOSE 3 HOUR GTT: 125 mg/dL (ref 65–139)
Glucose, Fasting: 81 mg/dL (ref 65–94)
Glucose, GTT - 1 Hour: 181 mg/dL — ABNORMAL HIGH (ref 65–179)

## 2016-03-12 ENCOUNTER — Telehealth: Payer: Self-pay | Admitting: Obstetrics and Gynecology

## 2016-03-12 NOTE — Telephone Encounter (Signed)
That is fine 

## 2016-03-12 NOTE — Telephone Encounter (Signed)
pls advise

## 2016-03-12 NOTE — Telephone Encounter (Signed)
PT CALLED AND WAS SUMMONS FOR [facility]Y DUTY AND SHEW ANTED TO KNOW IF [facility] COULD WRITE A LETTER STATING THAT SHE IS PREGNANT AND UNABLE TO SIT THRU JURY SO THAT SHE CAN GET OUT OF IT.

## 2016-03-15 NOTE — Telephone Encounter (Signed)
done

## 2016-03-15 NOTE — Telephone Encounter (Signed)
Note done and faxed to pt 

## 2016-03-15 NOTE — Telephone Encounter (Signed)
PT CALLED BACK ANS WANTED TO KNOW IF NOTE CAN BE WRITTEN. SHE NEEDED IT TODAY BC JURY DURY IS WED

## 2016-03-24 ENCOUNTER — Telehealth: Payer: Self-pay | Admitting: Obstetrics and Gynecology

## 2016-03-24 ENCOUNTER — Inpatient Hospital Stay
Admission: EM | Admit: 2016-03-24 | Discharge: 2016-03-31 | DRG: 765 | Disposition: A | Discharge status: Home / Self Care | Payer: Commercial Managed Care - HMO | Attending: Obstetrics and Gynecology | Admitting: Obstetrics and Gynecology

## 2016-03-24 ENCOUNTER — Encounter: Payer: Self-pay | Admitting: Obstetrics and Gynecology

## 2016-03-24 ENCOUNTER — Ambulatory Visit (INDEPENDENT_AMBULATORY_CARE_PROVIDER_SITE_OTHER): Payer: 59 | Admitting: Obstetrics and Gynecology

## 2016-03-24 ENCOUNTER — Other Ambulatory Visit (INDEPENDENT_AMBULATORY_CARE_PROVIDER_SITE_OTHER): Payer: 59

## 2016-03-24 DIAGNOSIS — O4290 Premature rupture of membranes, unspecified as to length of time between rupture and onset of labor, unspecified weeks of gestation: Secondary | ICD-10-CM

## 2016-03-24 DIAGNOSIS — Z23 Encounter for immunization: Secondary | ICD-10-CM | POA: Diagnosis not present

## 2016-03-24 DIAGNOSIS — O9912 Other diseases of the blood and blood-forming organs and certain disorders involving the immune mechanism complicating childbirth: Secondary | ICD-10-CM | POA: Diagnosis present

## 2016-03-24 DIAGNOSIS — D6959 Other secondary thrombocytopenia: Secondary | ICD-10-CM | POA: Diagnosis present

## 2016-03-24 DIAGNOSIS — Z369 Encounter for antenatal screening, unspecified: Secondary | ICD-10-CM

## 2016-03-24 DIAGNOSIS — O42119 Preterm premature rupture of membranes, onset of labor more than 24 hours following rupture, unspecified trimester: Secondary | ICD-10-CM

## 2016-03-24 DIAGNOSIS — D696 Thrombocytopenia, unspecified: Secondary | ICD-10-CM

## 2016-03-24 DIAGNOSIS — O411230 Chorioamnionitis, third trimester, not applicable or unspecified: Secondary | ICD-10-CM | POA: Diagnosis not present

## 2016-03-24 DIAGNOSIS — Z3A32 32 weeks gestation of pregnancy: Secondary | ICD-10-CM | POA: Diagnosis not present

## 2016-03-24 DIAGNOSIS — O42919 Preterm premature rupture of membranes, unspecified as to length of time between rupture and onset of labor, unspecified trimester: Secondary | ICD-10-CM

## 2016-03-24 DIAGNOSIS — Z3483 Encounter for supervision of other normal pregnancy, third trimester: Secondary | ICD-10-CM | POA: Diagnosis not present

## 2016-03-24 DIAGNOSIS — O99119 Other diseases of the blood and blood-forming organs and certain disorders involving the immune mechanism complicating pregnancy, unspecified trimester: Secondary | ICD-10-CM

## 2016-03-24 DIAGNOSIS — O42913 Preterm premature rupture of membranes, unspecified as to length of time between rupture and onset of labor, third trimester: Principal | ICD-10-CM | POA: Diagnosis present

## 2016-03-24 DIAGNOSIS — Z36 Encounter for antenatal screening of mother: Secondary | ICD-10-CM

## 2016-03-24 DIAGNOSIS — O99820 Streptococcus B carrier state complicating pregnancy: Secondary | ICD-10-CM

## 2016-03-24 DIAGNOSIS — O411290 Chorioamnionitis, unspecified trimester, not applicable or unspecified: Secondary | ICD-10-CM | POA: Diagnosis not present

## 2016-03-24 LAB — TYPE AND SCREEN
ABO/RH(D): O POS
ANTIBODY SCREEN: NEGATIVE

## 2016-03-24 LAB — CBC
HEMATOCRIT: 35.6 % (ref 35.0–47.0)
HEMOGLOBIN: 12.4 g/dL (ref 12.0–16.0)
MCH: 30.6 pg (ref 26.0–34.0)
MCHC: 34.8 g/dL (ref 32.0–36.0)
MCV: 87.8 fL (ref 80.0–100.0)
PLATELETS: 132 10*3/uL — AB (ref 150–440)
RBC: 4.06 MIL/uL (ref 3.80–5.20)
RDW: 13.8 % (ref 11.5–14.5)
WBC: 10.1 10*3/uL (ref 3.6–11.0)

## 2016-03-24 MED ORDER — OXYCODONE-ACETAMINOPHEN 5-325 MG PO TABS: 2.0000 | ORAL_TABLET | ORAL | Status: DC | PRN

## 2016-03-24 MED ORDER — LIDOCAINE HCL (PF) 1 % IJ SOLN: 30.0000 mL | INTRAMUSCULAR | Status: DC | PRN

## 2016-03-24 MED ORDER — OXYTOCIN 40 UNITS IN LACTATED RINGERS INFUSION - SIMPLE MED
2.5000 [IU]/h | INTRAVENOUS | Status: DC
Start: 2016-03-24 — End: 2016-03-25

## 2016-03-24 MED ORDER — FENTANYL CITRATE (PF) 100 MCG/2ML IJ SOLN: 50.0000 ug | INTRAMUSCULAR | Status: DC | PRN

## 2016-03-24 MED ORDER — LACTATED RINGERS IV SOLN: 500.0000 mL | INTRAVENOUS | Status: DC | PRN

## 2016-03-24 MED ORDER — MAGNESIUM SULFATE 2 GM/50ML IV SOLN
2.0000 g | Freq: Once | INTRAVENOUS | Status: DC
Start: 2016-03-24 — End: 2016-03-24
  Filled 2016-03-24: qty 50

## 2016-03-24 MED ORDER — MAGNESIUM SULFATE 4 GM/100ML IV SOLN
4.0000 g | Freq: Once | INTRAVENOUS | Status: AC
  Administered 2016-03-24: 4 g via INTRAVENOUS

## 2016-03-24 MED ORDER — ONDANSETRON HCL 4 MG/2ML IJ SOLN: 4.0000 mg | Freq: Four times a day (QID) | INTRAMUSCULAR | Status: DC | PRN

## 2016-03-24 MED ORDER — CALCIUM GLUCONATE 10 % IV SOLN
INTRAVENOUS | Status: AC
  Filled 2016-03-24: qty 10

## 2016-03-24 MED ORDER — MAGNESIUM SULFATE 50 % IJ SOLN
1.0000 g/h | INTRAVENOUS | Status: DC
  Administered 2016-03-24: 0.16 g/h via INTRAVENOUS
  Administered 2016-03-25: 2 g/h via INTRAVENOUS
  Filled 2016-03-24 (×2): qty 80

## 2016-03-24 MED ORDER — OXYCODONE-ACETAMINOPHEN 5-325 MG PO TABS: 1.0000 | ORAL_TABLET | ORAL | Status: DC | PRN

## 2016-03-24 MED ORDER — SOD CITRATE-CITRIC ACID 500-334 MG/5ML PO SOLN
30.0000 mL | ORAL | Status: DC | PRN
  Administered 2016-03-28: 30 mL via ORAL

## 2016-03-24 MED ORDER — BETAMETHASONE SOD PHOS & ACET 6 (3-3) MG/ML IJ SUSP
12.0000 mg | Freq: Once | INTRAMUSCULAR | Status: AC
  Administered 2016-03-25: 12 mg via INTRAMUSCULAR
  Filled 2016-03-24: qty 1

## 2016-03-24 MED ORDER — BETAMETHASONE SOD PHOS & ACET 6 (3-3) MG/ML IJ SUSP
12.0000 mg | Freq: Once | INTRAMUSCULAR | Status: DC
  Administered 2016-03-24: 12 mg via INTRAMUSCULAR

## 2016-03-24 MED ORDER — ACETAMINOPHEN 325 MG PO TABS
650.0000 mg | ORAL_TABLET | ORAL | Status: DC | PRN
  Administered 2016-03-26 – 2016-03-28 (×3): 650 mg via ORAL
  Filled 2016-03-24 (×3): qty 2

## 2016-03-24 MED ORDER — HYDROXYZINE HCL 50 MG PO TABS
50.0000 mg | ORAL_TABLET | Freq: Four times a day (QID) | ORAL | Status: DC | PRN
  Filled 2016-03-24: qty 1

## 2016-03-24 MED ORDER — OXYTOCIN BOLUS FROM INFUSION: 500.0000 mL | Freq: Once | INTRAVENOUS | Status: DC

## 2016-03-24 MED ORDER — LACTATED RINGERS IV SOLN
INTRAVENOUS | Status: DC
  Administered 2016-03-24 – 2016-03-26 (×6): via INTRAVENOUS
  Administered 2016-03-27: 125 mL/h via INTRAVENOUS
  Administered 2016-03-27: 1000 mL via INTRAVENOUS
  Administered 2016-03-27: 23:00:00 via INTRAVENOUS
  Administered 2016-03-27: 999 mL/h via INTRAVENOUS
  Administered 2016-03-27: 04:00:00 via INTRAVENOUS

## 2016-03-24 MED ORDER — CLINDAMYCIN PHOSPHATE 900 MG/50ML IV SOLN
900.0000 mg | Freq: Three times a day (TID) | INTRAVENOUS | Status: DC
  Administered 2016-03-24 – 2016-03-27 (×11): 900 mg via INTRAVENOUS
  Filled 2016-03-24 (×13): qty 50

## 2016-03-24 NOTE — Plan of Care (Signed)
Mag sulfate started. 4 gm bolus given, then 2 gm per hour. Pt educated regarding effects of mag sulfate.

## 2016-03-24 NOTE — Progress Notes (Signed)
[name] is a 40 y.o. G2P0101 at 3912w6d by LMP admitted for rupture of membranes, Preterm labor  Subjective: Denies feeling any contractions, and only states some anxiety about situation  Objective: Temp 98.9 F (37.2 C)   Resp 20   Ht 5\' 4"  (1.626 m)   Wt 175 lb (79.4 kg)   LMP 08/14/2015   BMI 30.04 kg/m  No intake/output data recorded. HR at 120-140s since Magnesium administration- asymptomatic Total I/O In: 675 [I.V.:575; IV Piggyback:100] Out: 300 [Urine:300]  FHT:  FHR: 160 bpm, variability: marked,  accelerations:  Present,  decelerations:  Absent UC:   irregular, every 8-10 minutes SVE:    deferred  Labs: Lab Results  Component Value Date   WBC 10.1 03/24/2016   HGB 12.4 03/24/2016   HCT 35.6 03/24/2016   MCV 87.8 03/24/2016   PLT 132 (L) 03/24/2016    Assessment / Plan: PPROM Discussed blood consent form again, patient declines certain blood products and signed refusal form, attached card with permissible products according to religious beliefs. Labor: no changes to date except spacing out of contractions Preeclampsia:  no signs or symptoms of toxicity Fetal Wellbeing:  Category I Pain Control:  Labor support without medications I/D:  n/a Anticipated MOD:  NSVD  Morgan Pham, CNM 03/24/2016, 6:59 PM

## 2016-03-24 NOTE — Progress Notes (Signed)
Pt here for work in, periods of gushing fluid when stands. Pt taken directly to room.

## 2016-03-24 NOTE — Progress Notes (Signed)
Morgan AlesRuth Pham is a 40 y.o. G2P0101 at 5813w6d by LMP admitted for Preterm labor, PROM  Subjective: Reports feeling tightening with contractions since arriving at hospital  Objective: Temp 99.4 F (37.4 C)   LMP 08/14/2015  No intake/output data recorded. No intake/output data recorded.  FHT:  FHR: 140 bpm, variability: moderate,  accelerations:  Present,  decelerations:  Absent UC:   irregular, every 7-8 minutes, mild to palpation SVE:      Labs: Lab Results  Component Value Date   WBC 10.1 03/24/2016   HGB 12.4 03/24/2016   HCT 35.6 03/24/2016   MCV 87.8 03/24/2016   PLT 132 (L) 03/24/2016    Assessment / Plan: PPROM  Labor: not active at this time, but will start MagSulfate to try to abort until second dose betamethasone administered. Preeclampsia:  labs stable Fetal Wellbeing:  Category I Pain Control:  Labor support without medications I/D:  n/a Anticipated MOD:  NSVD  Taras Rask N Janay Canan, CNM 03/24/2016, 1:31 PM

## 2016-03-24 NOTE — Telephone Encounter (Signed)
Patient called stating she thinks she may be leaking fluid. You can reach her at [phone].Thanks

## 2016-03-24 NOTE — Progress Notes (Signed)
Work in [employer]- reports large gush of fluid around 10am with continued leaking since then- fluid is clear, denies contractions or bleeding. Sterile speculum exam reveals grossly ruptured with clear fluid NTZ and Fern + , ultrasound preformed revealing 14cm fluid and vertex presentation. 1st dose betamethosone given here and GBS culture obtained. Counseled on PPROM and possible delivery, preterm infants.

## 2016-03-24 NOTE — H&P (Signed)
Morgan Pham is a 40 y.o. female presenting for PPROM at [redacted]w[redacted]d OB History    Gravida Para Term Preterm AB Living   2 1   1   1    SAB TAB Ectopic Multiple Live Births           1     Past Medical History:  Diagnosis Date  . Allergy   . Asthma    childhood   Past Surgical History:  Procedure Laterality Date  . DILATION AND CURETTAGE OF UTERUS     poc x2  . LEEP     2Loistine Simashas been getting pap's now yearly   Family History: family history includes Asthma in her brother, daughter, and father; Heart failure in her father; Hyperlipidemia in her father; Migraines in her mother; Rashes / Skin problems in her daughter. Social History:  reports that she has never smoked. She has never used smokeless tobacco. She reports that she does not drink alcohol or use drugs.     Maternal Diabetes: No Genetic Screening: Normal Maternal Ultrasounds/Referrals: Normal Fetal Ultrasounds or other Referrals:  None Maternal Substance Abuse:  No Significant Maternal Medications:  None Significant Maternal Lab Results:  None Other Comments:  grossly ruptured today at 10am  ROS History Dilation: Fingertip Effacement (%): Thick Station: Ballotable Last menstrual period 08/14/2015. Exam Physical Exam  A&O x4  well groomed female, anxious and crying HRR lungs clear, afebrile Abdomen gravid but soft, no contractions noted. Sterile speculum exam reveals grossly ruptured clear fluid No pedal edema Prenatal labs: ABO, Rh: O/Positive/-- (02/16 0935) Antibody: Negative (02/16 0935) Rubella: <20.0 (02/16 0935) RPR: Non Reactive (02/16 0935)  HBsAg: Negative (02/16 0935)  HIV: Non Reactive (02/16 0935)  GBS:   unknown-obtained today  Assessment/Plan: PPROM at 315w6dsent to L&D for IVF, EFM and continued care; Dr ChMarcelline Matesware and agrees with plan of care, will alert neonatology   Morgan Pham/04/2016, 12:17 PM   Obstetric History and Physical  RuCambry Spampinatos a 40 y.o. G2P0101 with IUP  at 3153w6desenting with PPROM. Patient states she has been having  none contractions, none vaginal bleeding, ruptured, clear fluid, sterile speculum exam confirms ruptured membranes, with active fetal movement.    [facility]previous PTD at 35 50eks AMA  Prenatal labs and studies: ABO, Rh: O/Positive/-- (02/16 0935) Antibody: Negative (02/16 0935) Rubella: <20.0 (02/16 0935) RPR: Non Reactive (02/16 0935)  HBsAg: Negative (02/16 0935)  HIV: Non Reactive (02/16 0935)  GBS:  1 hr Glucola  normal Genetic screening normal Anatomy US [place]  Past Medical History:  Diagnosis Date  . Allergy   . Asthma    childhood    Past Surgical History:  Procedure Laterality Date  . DILATION AND CURETTAGE OF UTERUS     poc x2  . LEEP     40 has been getting pap's now yearly    OB History  Gravida Para Term Preterm AB Living  2 1   1   1   SAB TAB Ectopic Multiple Live Births          1    # Outcome Date GA Lbr Len/2nd Weight Sex Delivery Anes PTL Lv  2 Current           1 Preterm 05/2006 35w28w0dlb 9 oz (2.523 kg) F Vag-Spont  N LIV      Social History   Social History  . Marital status: Married  Spouse name: N/A  . Number of children: N/A  . Years of education: N/A   Occupational History  . paralegal    Social History Main Topics  . Smoking status: Never Smoker  . Smokeless tobacco: Never Used  . Alcohol use No     Comment: occasionally  . Drug use: No  . Sexual activity: Yes    Partners: Male    Birth control/ protection: None   Other Topics Concern  . Not on file   Social History Narrative  . No narrative on file    Family History  Problem Relation Age of Onset  . Migraines Mother   . Asthma Father   . Heart failure Father     heart attack  . Hyperlipidemia Father   . Asthma Brother   . Asthma Daughter   . Rashes / Skin problems Daughter      (Not in a hospital admission)  Allergies   Allergen Reactions  . Shellfish Allergy   . Penicillins Rash  . Sulfa Antibiotics Rash    Review of Systems: Negative except for what is mentioned in HPI.  Physical Exam: LMP 08/14/2015  GENERAL: Well-developed, well-nourished female in no acute distress.  LUNGS: Clear to auscultation bilaterally.  HEART: Regular rate and rhythm. ABDOMEN: Soft, nontender, nondistended, gravid. EXTREMITIES: Nontender, no edema, 2+ distal pulses. Cervical Exam: Dilation: Fingertip Effacement (%): Thick Station: Ballotable Presentation: Vertex FHT:  Baseline rate 144 bpm   Variability moderate  Accelerations present   Decelerations none    Pertinent Labs/Studies:   No results found for this or any previous visit (from the past 24 hour(s)).  Assessment : Morgan Pham is a 40 y.o. G2P0101 at 50w6dbeing admitted for labor.  Plan: Labor: will try to abort labor until steroids are on board. FWB: Reassuring fetal heart tracing.  GBS unknown- obtained culture today   Morgan Pham, CNM [facility]

## 2016-03-24 NOTE — Progress Notes (Signed)
4 gm bolus dose complete. Pt started on 2gm maintenance dose.

## 2016-03-24 NOTE — Telephone Encounter (Signed)
Gushed out fluid when went to bathroom. Now on commode and fluid is trickling. [place] husband [place] coming to get her. She is to come to office asap. Hx pre-term delivery.

## 2016-03-25 ENCOUNTER — Inpatient Hospital Stay: Payer: Commercial Managed Care - HMO

## 2016-03-25 LAB — CBC WITH DIFFERENTIAL/PLATELET
BASOS ABS: 0 10*3/uL (ref 0–0.1)
BASOS PCT: 0 %
EOS ABS: 0 10*3/uL (ref 0–0.7)
EOS PCT: 0 %
HCT: 34.5 % — ABNORMAL LOW (ref 35.0–47.0)
HEMOGLOBIN: 11.7 g/dL — AB (ref 12.0–16.0)
Lymphocytes Relative: 9 %
Lymphs Abs: 1.4 10*3/uL (ref 1.0–3.6)
MCH: 30.3 pg (ref 26.0–34.0)
MCHC: 33.8 g/dL (ref 32.0–36.0)
MCV: 89.7 fL (ref 80.0–100.0)
Monocytes Absolute: 0.9 10*3/uL (ref 0.2–0.9)
Monocytes Relative: 5 %
NEUTROS PCT: 86 %
Neutro Abs: 13.7 10*3/uL — ABNORMAL HIGH (ref 1.4–6.5)
PLATELETS: 144 10*3/uL — AB (ref 150–440)
RBC: 3.85 MIL/uL (ref 3.80–5.20)
RDW: 13.9 % (ref 11.5–14.5)
WBC: 16 10*3/uL — AB (ref 3.6–11.0)

## 2016-03-25 LAB — MAGNESIUM: MAGNESIUM: 5.9 mg/dL — AB (ref 1.7–2.4)

## 2016-03-25 LAB — RPR: RPR Ser Ql: NONREACTIVE

## 2016-03-25 MED ORDER — PRENATAL MULTIVITAMIN CH
1.0000 | ORAL_TABLET | Freq: Every day | ORAL | Status: DC
  Administered 2016-03-25: 1 via ORAL
  Filled 2016-03-25 (×4): qty 1

## 2016-03-25 MED ORDER — DOCUSATE SODIUM 100 MG PO CAPS
100.0000 mg | ORAL_CAPSULE | Freq: Two times a day (BID) | ORAL | Status: DC | PRN
  Administered 2016-03-25 – 2016-03-26 (×3): 100 mg via ORAL
  Filled 2016-03-25 (×3): qty 1

## 2016-03-25 MED ORDER — POLYETHYLENE GLYCOL 3350 17 G PO PACK
17.0000 g | PACK | Freq: Every day | ORAL | Status: DC | PRN
  Administered 2016-03-25: 17 g via ORAL
  Filled 2016-03-25 (×4): qty 1

## 2016-03-25 NOTE — Progress Notes (Signed)
Morgan Pham is a 40 y.o. G2P0101 at 2241w0d who is admitted for Preterm labor, PROM.  Estimated Date of Delivery: 05/20/16 Fetal presentation is cephalic.  Length of Stay:  1 Days. Admitted 03/24/2016  Subjective: Denies contractions through the night, slept some Patient reports good fetal movement.  She reports rare uterine contractions, no bleeding.  Vitals:  Blood pressure 116/72, pulse (!) 101, temperature 98.4 F (36.9 C), temperature source Oral, resp. rate 20, height 5\' 4"  (1.626 m), weight 175 lb (79.4 kg), last menstrual period 08/14/2015, SpO2 98 %. Physical Examination: CONSTITUTIONAL: Well-developed, well-nourished female in no acute distress.  SKIN: Skin is warm and dry. No rash noted. Not diaphoretic. No erythema. No pallor. NEUROLGIC: Alert and oriented to person, place, and time. Normal reflexes, muscle tone coordination. No cranial nerve deficit noted. PSYCHIATRIC: Normal mood and affect. Normal behavior. Normal judgment and thought content. CARDIOVASCULAR: Normal heart rate noted, regular rhythm RESPIRATORY: Effort and breath sounds normal, no problems with respiration noted MUSCULOSKELETAL: Normal range of motion. No edema and no tenderness. 2+ distal pulses. ABDOMEN: Soft, nontender, nondistended, gravid. CERVIX:  not examined  Fetal monitoring: FHR: 140 bpm, Variability: moderate, Accelerations: Present, Decelerations: Absent  Uterine activity: 1-2 contractions per hour  Results for orders placed or performed during the hospital encounter of 03/24/16 (from the past 48 hour(s))  CBC     Status: Abnormal   Collection Time: 03/24/16 12:47 PM  Result Value Ref Range   WBC 10.1 3.6 - 11.0 K/uL   RBC 4.06 3.80 - 5.20 MIL/uL   Hemoglobin 12.4 12.0 - 16.0 g/dL   HCT 16.135.6 09.635.0 - 04.547.0 %   MCV 87.8 80.0 - 100.0 fL   MCH 30.6 26.0 - 34.0 pg   MCHC 34.8 32.0 - 36.0 g/dL   RDW 40.913.8 81.111.5 - 91.414.5 %   Platelets 132 (L) 150 - 440 K/uL  RPR     Status: None   Collection Time:  03/24/16 12:47 PM  Result Value Ref Range   RPR Ser Ql Non Reactive Non Reactive    Comment: (NOTE) Performed At: [facility] [address] [place], [place] [id number] Mila HomerHancock William F MD YQ:[id number]:[phone]   Type and screen     Status: None   Collection Time: 03/24/16 12:47 PM  Result Value Ref Range   ABO/RH(D) O POS    Antibody Screen NEG    Sample Expiration 03/27/2016     [place] Ob Limited  Result Date: 03/24/2016 ULTRASOUND REPORT Location: [facility] Date of Service: 03/24/16 Indications: AFI and Presentation for suspected PROM Findings: Mason JimSingleton intrauterine pregnancy is visualized with FHR at 147 BPM. Fetal presentation is Vertex, spine left lateral. Placenta: Anterior, grade 1 AFI: 14.2 cm, Adequate Impression: 1.  AFI is adequate at 14.2 cm. 2. Presentation is vertex, spine left. Recommendations: 1.Clinical correlation with the patient's History and Physical Exam. Elliott,Teresa, RT Scan reviewed and agree with findings, discussed with patient and spouse. Melody PatokaShambley, CNM   Current scheduled medications . betamethasone acetate-betamethasone sodium phosphate  12 mg Intramuscular Once  . clindamycin (CLEOCIN) IV  900 mg Intravenous Q8H  . oxytocin 40 units in LR 1000 mL  500 mL Intravenous Once    I have reviewed the patient's current medications.  ASSESSMENT: Patient Active Problem List   Diagnosis Date Noted  . Preterm premature rupture of membranes (PPROM) with unknown onset of labor 03/24/2016  . Maternal varicella, non-immune 10/04/2015  . Rubella non-immune status, antepartum 10/03/2015    PLAN: Will continue  mag-sulfate until 24 hours post second dose betamethasone; u/s ordered today for AFI and EFW.   MELODY Suzan Nailer, CNM [facility]

## 2016-03-26 DIAGNOSIS — O99820 Streptococcus B carrier state complicating pregnancy: Secondary | ICD-10-CM

## 2016-03-26 NOTE — Progress Notes (Signed)
Morgan Pham is a 40 y.o. G2P0101 at 3831w1d who is admitted for rupture of membranes, PROM.  Estimated Date of Delivery: 05/20/16 Fetal presentation is cephalic.  Length of Stay:  2 Days. Admitted 03/24/2016  Subjective: Rested well through the nigh, does have mild headache Patient reports good fetal movement.  She reports rare uterine contractions, no bleeding and mild loss of fluid per vagina.  Vitals:  Blood pressure (!) 92/52, pulse 83, temperature 98.5 F (36.9 C), temperature source Oral, resp. rate 18, height 5\' 4"  (1.626 m), weight 175 lb (79.4 kg), last menstrual period 08/14/2015, SpO2 95 %. Physical Examination: CONSTITUTIONAL: Well-developed, well-nourished female in no acute distress.  SKIN: Skin is warm and dry. No rash noted. Not diaphoretic. No erythema. No pallor. NEUROLGIC: Alert and oriented to person, place, and time. Normal reflexes, muscle tone coordination. No cranial nerve deficit noted. PSYCHIATRIC: Normal mood and affect. Normal behavior. Normal judgment and thought content. CARDIOVASCULAR: Normal heart rate noted, regular rhythm RESPIRATORY: Effort and breath sounds normal, no problems with respiration noted MUSCULOSKELETAL: Normal range of motion. No edema and no tenderness. 2+ distal pulses. ABDOMEN: Soft, nontender, nondistended, gravid. CERVIX:     Results for orders placed or performed during the hospital encounter of 03/24/16 (from the past 48 hour(s))  CBC     Status: Abnormal   Collection Time: 03/24/16 12:47 PM  Result Value Ref Range   WBC 10.1 3.6 - 11.0 K/uL   RBC 4.06 3.80 - 5.20 MIL/uL   Hemoglobin 12.4 12.0 - 16.0 g/dL   HCT 16.135.6 09.635.0 - 04.547.0 %   MCV 87.8 80.0 - 100.0 fL   MCH 30.6 26.0 - 34.0 pg   MCHC 34.8 32.0 - 36.0 g/dL   RDW 40.913.8 81.111.5 - 91.414.5 %   Platelets 132 (L) 150 - 440 K/uL  RPR     Status: None   Collection Time: 03/24/16 12:47 PM  Result Value Ref Range   RPR Ser Ql Non Reactive Non Reactive    Comment: (NOTE) Performed At: [facility] [address] [place], [place] [id number] Mila HomerHancock William F MD YQ:[id number]:[phone]   Type and screen     Status: None   Collection Time: 03/24/16 12:47 PM  Result Value Ref Range   ABO/RH(D) O POS    Antibody Screen NEG    Sample Expiration 03/27/2016   CBC with Differential/Platelet     Status: Abnormal   Collection Time: 03/25/16  8:30 AM  Result Value Ref Range   WBC 16.0 (H) 3.6 - 11.0 K/uL   RBC 3.85 3.80 - 5.20 MIL/uL   Hemoglobin 11.7 (L) 12.0 - 16.0 g/dL   HCT 52.834.5 (L) 41.335.0 - 24.447.0 %   MCV 89.7 80.0 - 100.0 fL   MCH 30.3 26.0 - 34.0 pg   MCHC 33.8 32.0 - 36.0 g/dL   RDW 01.013.9 27.211.5 - 53.614.5 %   Platelets 144 (L) 150 - 440 K/uL   Neutrophils Relative % 86 %   Neutro Abs 13.7 (H) 1.4 - 6.5 K/uL   Lymphocytes Relative 9 %   Lymphs Abs 1.4 1.0 - 3.6 K/uL   Monocytes Relative 5 %   Monocytes Absolute 0.9 0.2 - 0.9 K/uL   Eosinophils Relative 0 %   Eosinophils Absolute 0.0 0 - 0.7 K/uL   Basophils Relative 0 %   Basophils Absolute 0.0 0 - 0.1 K/uL  Magnesium     Status: Abnormal   Collection Time: 03/25/16  8:30 AM  Result Value Ref  Range   Magnesium 5.9 (H) 1.7 - 2.4 mg/dL    US Ob Limited  Result Date: 03/24/2016 ULTRASOUND REPORT Location: [facility] Date of Service: 03/24/16 Indications: AFI and Presentation for suspected PROM Findings: Singleton intrauterine pregnancy is visualized with FHR at 147 BPM. Fetal presentation is Vertex, spine left lateral. Placenta: Anterior, grade 1 AFI: 14.2 cm, Adequate Impression: 1.  AFI is adequate at 14.2 cm. 2. Presentation is vertex, spine left. Recommendations: 1.Clinical correlation with the patient's History and Physical Exam. Elliott,Teresa, RT Scan reviewed and agree with findings, discussed with patient and spouse. Jeweline Reif [place], CNM  US Ob Follow Up  Result Date: 03/25/2016 CLINICAL DATA:  Current assigned gestational age of [redacted] weeks 0 days. Premature rupture of membranes. EXAM: OBSTETRICAL ULTRASOUND  >14 WKS COMPARISON:  None FINDINGS: Number of Fetuses: 1 Heart Rate:  158 bpm Movement: Yes Presentation: Cephalic Previa: No Placental Location: Anterior Amniotic Fluid (Subjective): Subjectively decreased Amniotic Fluid (Objective): AFI 13.6 cm (5%ile= 8.6 cm, 95%= 24.2 cm for 32 wks) FETAL BIOMETRY BPD:  8.6cm 34w 4d HC:    30.2cm  33w   4d AC:   29.6cm  33w   4d FL:   6.0cm  31w   0d Current Mean GA: 32w 6d              [place] EDC: 05/14/2016 Assigned GA:  32w 0d       Assigned EDC:  EDC 05/20/2016 Estimated Fetal Weight:  2,087g    71%ile FETAL ANATOMY Lateral Ventricles: Appears normal Thalami/CSP: Appears normal Posterior Fossa:  Appears normal Nuchal Region: Not visualized Upper Lip: Not visualized Spine: Limited views appear normal 4 Chamber Heart on Left: Appears normal LVOT: Not visualized RVOT: Not visualized Stomach on Left: Appears normal 3 Vessel Cord: Appears normal Cord Insertion site: Not visualized Kidneys: Appears normal Bladder: Appears normal Extremities: Not well visualized Sex: Not visualized Technically difficult due to: Advanced gestational age and fetal position Maternal Findings: Cervix:  3.8 cm IMPRESSION: Assigned gestational age is currently 32 weeks 0 days. Appropriate fetal growth, with EFW at 71 percentile. Subjectively decreased amniotic fluid volume, with AFI measuring 13.6 cm. Suboptimal evaluation of fetal anatomy, however no fetal abnormalities seen involving visualized anatomy listed above. Electronically Signed   By: Myles Rosenthal M.D.   On: 03/25/2016 11:03    Current scheduled medications . clindamycin (CLEOCIN) IV  900 mg Intravenous Q8H  . prenatal multivitamin  1 tablet Oral Q1200    I have reviewed the patient's current medications.  ASSESSMENT: Patient Active Problem List   Diagnosis Date Noted  . Preterm premature rupture of membranes (PPROM) with unknown onset of labor 03/24/2016  . Maternal varicella, non-immune 10/04/2015  . Rubella non-immune status,  antepartum 10/03/2015  GBS+  PLAN: Will add TED hose, and consult Neonatology regarding GBS status Continue routine antenatal   Shakisha Abend N Ladislao Cohenour, CNM [facility]

## 2016-03-27 LAB — CBC
HCT: 29.5 % — ABNORMAL LOW (ref 35.0–47.0)
HEMOGLOBIN: 9.9 g/dL — AB (ref 12.0–16.0)
MCH: 30.2 pg (ref 26.0–34.0)
MCHC: 33.7 g/dL (ref 32.0–36.0)
MCV: 89.6 fL (ref 80.0–100.0)
PLATELETS: 113 10*3/uL — AB (ref 150–440)
RBC: 3.29 MIL/uL — ABNORMAL LOW (ref 3.80–5.20)
RDW: 14.1 % (ref 11.5–14.5)
WBC: 11.6 10*3/uL — ABNORMAL HIGH (ref 3.6–11.0)

## 2016-03-27 MED ORDER — ALUM & MAG HYDROXIDE-SIMETH 200-200-20 MG/5ML PO SUSP
30.0000 mL | ORAL | Status: DC | PRN
  Administered 2016-03-27: 30 mL via ORAL
  Filled 2016-03-27: qty 30

## 2016-03-27 MED ORDER — OXYTOCIN 40 UNITS IN LACTATED RINGERS INFUSION - SIMPLE MED
INTRAVENOUS | Status: AC
  Administered 2016-03-27: 2 m[IU]/min via INTRAVENOUS
  Filled 2016-03-27: qty 1000

## 2016-03-27 MED ORDER — OXYTOCIN 40 UNITS IN LACTATED RINGERS INFUSION - SIMPLE MED
1.0000 m[IU]/min | INTRAVENOUS | Status: DC
  Administered 2016-03-27: 2 m[IU]/min via INTRAVENOUS
  Filled 2016-03-27: qty 1000

## 2016-03-27 MED ORDER — FENTANYL CITRATE (PF) 100 MCG/2ML IJ SOLN
50.0000 ug | INTRAMUSCULAR | Status: DC | PRN
  Administered 2016-03-27 (×3): 100 ug via INTRAVENOUS
  Administered 2016-03-27: 50 ug via INTRAVENOUS
  Administered 2016-03-27 – 2016-03-28 (×2): 100 ug via INTRAVENOUS
  Filled 2016-03-27 (×6): qty 2

## 2016-03-27 NOTE — Progress Notes (Signed)
Sherron AlesRuth Blume is a 40 y.o. G2P0101 at 3838w2d who is admitted for PROM.  Estimated Date of Delivery: 05/20/16 Fetal presentation is cephalic.confirmed by bedside ultrasound  Length of Stay:  3 Days. Admitted 03/24/2016  Subjective: Reports constant pain in lower pelvis, somewhat relieved by fentanyl, but been persistent all morning, with worse pain during contractions,also noting small gushes of clear fluid Patient reports good fetal movement.  She reports regular uterine contractions,.  Vitals:  Blood pressure 97/62, pulse (!) 107, temperature 99.2 F (37.3 C), temperature source Oral, resp. rate 18, height 5\' 4"  (1.626 m), weight 175 lb (79.4 kg), last menstrual period 08/14/2015, SpO2 97 %. Physical Examination: CONSTITUTIONAL: Well-developed, well-nourished female in no acute distress.  SKIN: Skin is warm and dry. No rash noted. Not diaphoretic. No erythema. No pallor. NEUROLGIC: Alert and oriented to person, place, and time. Normal reflexes, muscle tone coordination. No cranial nerve deficit noted. PSYCHIATRIC: Normal mood and affect. Normal behavior. Normal judgment and thought content. CARDIOVASCULAR: Normal heart rate noted, regular rhythm RESPIRATORY: Effort and breath sounds normal, no problems with respiration noted MUSCULOSKELETAL: Normal range of motion. No edema and no tenderness. 2+ distal pulses. ABDOMEN: Soft, nontender, nondistended, gravid. CERVIX: Dilation: 1 Effacement (%): 50 Exam by:: Analisia Kingsford unable to reach cervix and presenting part  Fetal monitoring: FHR: 1** bpm, Variability: moderate, Accelerations: Present, Decelerations: Absent  Uterine activity: 6-7 contractions per hour, although difficult to monitor  Results for orders placed or performed during the hospital encounter of 03/24/16 (from the past 48 hour(s))  CBC     Status: Abnormal   Collection Time: 03/27/16  6:02 AM  Result Value Ref Range   WBC 11.6 (H) 3.6 - 11.0 K/uL   RBC 3.29 (L) 3.80 - 5.20 MIL/uL    Hemoglobin 9.9 (L) 12.0 - 16.0 g/dL   HCT 40.929.5 (L) 81.135.0 - 91.447.0 %   MCV 89.6 80.0 - 100.0 fL   MCH 30.2 26.0 - 34.0 pg   MCHC 33.7 32.0 - 36.0 g/dL   RDW 78.214.1 95.611.5 - 21.314.5 %   Platelets 113 (L) 150 - 440 K/uL    No results found.  Current scheduled medications . clindamycin (CLEOCIN) IV  900 mg Intravenous Q8H  . prenatal multivitamin  1 tablet Oral Q1200    I have reviewed the patient's current medications.  ASSESSMENT: Patient Active Problem List   Diagnosis Date Noted  . GBS (group B Streptococcus carrier), +RV culture, currently pregnant 03/26/2016  . Preterm premature rupture of membranes (PPROM) with unknown onset of labor 03/24/2016  . Maternal varicella, non-immune 10/04/2015  . Rubella non-immune status, antepartum 10/03/2015    PLAN: Foley catherter placed after bladder visualized on u/s as over full, 1400cc noted, and catheter left in for now.  Continue routine antenatal   Glennie Bose N Anmol Fleck, CNM [facility]

## 2016-03-27 NOTE — Progress Notes (Signed)
Morgan Pham is a 40 y.o. G2P0101 at 4632w2d who is admitted for PROM.  Estimated Date of Delivery: 05/20/16 Fetal presentation is cephalic.  Length of Stay:  3 Days. Admitted 03/24/2016  Subjective: Denies pain at this time, only heartburn. Patient reports good fetal movement.  She reports no noticable uterine contractions.  Vitals:  Blood pressure 118/63, pulse 98, temperature 100 F (37.8 C), temperature source Oral, resp. rate 16, height 5\' 4"  (1.626 m), weight 175 lb (79.4 kg), last menstrual period 08/14/2015, SpO2 97 %. Physical Examination: CONSTITUTIONAL: Well-developed, well-nourished female in no acute distress.  SKIN: Skin is warm and dry. No rash noted. Not diaphoretic. No erythema. No pallor. NEUROLGIC: Alert and oriented to person, place, and time. Normal reflexes, muscle tone coordination. No cranial nerve deficit noted. PSYCHIATRIC: Normal mood and affect. Normal behavior. Normal judgment and thought content. CARDIOVASCULAR: Normal heart rate noted, regular rhythm RESPIRATORY: Effort and breath sounds normal, no problems with respiration noted MUSCULOSKELETAL: Normal range of motion. No edema and no tenderness. 2+ distal pulses. ABDOMEN: Soft, nontender, nondistended, gravid. Foley in place CERVIX: Dilation: 1 Effacement (%): 50 Exam by:: Pham  Fetal monitoring: FHR: 160 bpm, Variability: minimal, Accelerations: Present, Decelerations: early Uterine activity: 1-2 contractions per hour  Results for orders placed or performed during the hospital encounter of 03/24/16 (from the past 48 hour(s))  CBC     Status: Abnormal   Collection Time: 03/27/16  6:02 AM  Result Value Ref Range   WBC 11.6 (H) 3.6 - 11.0 K/uL   RBC 3.29 (L) 3.80 - 5.20 MIL/uL   Hemoglobin 9.9 (L) 12.0 - 16.0 g/dL   HCT 40.929.5 (L) 81.135.0 - 91.447.0 %   MCV 89.6 80.0 - 100.0 fL   MCH 30.2 26.0 - 34.0 pg   MCHC 33.7 32.0 - 36.0 g/dL   RDW 78.214.1 95.611.5 - 21.314.5 %   Platelets 113 (L) 150 - 440 K/uL    No results  found.  Current scheduled medications . clindamycin (CLEOCIN) IV  900 mg Intravenous Q8H  . prenatal multivitamin  1 tablet Oral Q1200    I have reviewed the patient's current medications. Discussed findings and plan of care with Dr Valentino Saxonherry.  ASSESSMENT: Patient Active Problem List   Diagnosis Date Noted  . GBS (group B Streptococcus carrier), +RV culture, currently pregnant 03/26/2016  . Preterm premature rupture of membranes (PPROM) with unknown onset of labor 03/24/2016  . Maternal varicella, non-immune 10/04/2015  . Rubella non-immune status, antepartum 10/03/2015    PLAN: Maalox prn Will continue to monitor temp hourly, and will induce if >100.4.   Morgan Pham, CNM [facility]

## 2016-03-27 NOTE — Consult Note (Signed)
Neonatology Prenatal Consultation: Asked to see this mother with PPROM and prior delivery at 35 weeks, now with rising temperature to 100F.  I reviewed the medical records. She has had some abdominal pain earlier that was relieved by straight bladder catheterization which yielded > 1 L of urine.  Feels better now.  Has received two doses of betamethasone in expectation of early delivery.  If she develops fever, then her physicians will begin augmenting labor with pitocin.  I discussed the expected hospital course for a 31 week premature baby that included likely need for respiratory support, IV nutrition followed by tube feedings and the typical expected length of stay.   Morgan Pham L. Minus BreedingAuten M.D.

## 2016-03-27 NOTE — Progress Notes (Signed)
Pt c/o contractions every 5 to 15 min. To [facility] via W/C. Report given to A.Martie RoundMilner, RN.

## 2016-03-28 ENCOUNTER — Encounter: Payer: Self-pay | Admitting: *Deleted

## 2016-03-28 ENCOUNTER — Inpatient Hospital Stay: Payer: Commercial Managed Care - HMO | Admitting: Anesthesiology

## 2016-03-28 ENCOUNTER — Encounter
Admission: EM | Disposition: A | Discharge status: Home / Self Care | Payer: Self-pay | Source: Home / Self Care | Attending: Obstetrics and Gynecology

## 2016-03-28 DIAGNOSIS — Z3483 Encounter for supervision of other normal pregnancy, third trimester: Secondary | ICD-10-CM

## 2016-03-28 LAB — CBC WITH DIFFERENTIAL/PLATELET
BASOS ABS: 0 10*3/uL (ref 0–0.1)
Basophils Relative: 0 %
Eosinophils Absolute: 0.1 10*3/uL (ref 0–0.7)
Eosinophils Relative: 1 %
HCT: 30.6 % — ABNORMAL LOW (ref 35.0–47.0)
Hemoglobin: 10.4 g/dL — ABNORMAL LOW (ref 12.0–16.0)
LYMPHS PCT: 8 %
Lymphs Abs: 1.2 10*3/uL (ref 1.0–3.6)
MCH: 30 pg (ref 26.0–34.0)
MCHC: 33.9 g/dL (ref 32.0–36.0)
MCV: 88.4 fL (ref 80.0–100.0)
MONO ABS: 0.9 10*3/uL (ref 0.2–0.9)
MONOS PCT: 6 %
NEUTROS ABS: 12.6 10*3/uL — AB (ref 1.4–6.5)
Neutrophils Relative %: 85 %
PLATELETS: 109 10*3/uL — AB (ref 150–440)
RBC: 3.46 MIL/uL — AB (ref 3.80–5.20)
RDW: 14 % (ref 11.5–14.5)
WBC: 14.9 10*3/uL — ABNORMAL HIGH (ref 3.6–11.0)

## 2016-03-28 LAB — TYPE AND SCREEN
ABO/RH(D): O POS
ANTIBODY SCREEN: NEGATIVE

## 2016-03-28 SURGERY — Surgical Case
Anesthesia: Epidural

## 2016-03-28 MED ORDER — NALOXONE HCL 0.4 MG/ML IJ SOLN: 0.4000 mg | INTRAMUSCULAR | Status: DC | PRN

## 2016-03-28 MED ORDER — CLINDAMYCIN PHOSPHATE 900 MG/50ML IV SOLN
900.0000 mg | Freq: Three times a day (TID) | INTRAVENOUS | Status: DC
  Administered 2016-03-28 (×2): 900 mg via INTRAVENOUS
  Filled 2016-03-28: qty 50

## 2016-03-28 MED ORDER — FENTANYL 2.5 MCG/ML W/ROPIVACAINE 0.2% IN NS 100 ML EPIDURAL INFUSION (ARMC-ANES)
EPIDURAL | Status: AC
  Filled 2016-03-28: qty 100

## 2016-03-28 MED ORDER — FENTANYL CITRATE (PF) 100 MCG/2ML IJ SOLN
INTRAMUSCULAR | Status: DC | PRN
  Administered 2016-03-28: 100 ug via EPIDURAL
  Administered 2016-03-28: 50 ug via INTRAVENOUS

## 2016-03-28 MED ORDER — ACETAMINOPHEN 325 MG PO TABS
ORAL_TABLET | ORAL | Status: AC
  Administered 2016-03-28: 650 mg via ORAL
  Filled 2016-03-28: qty 2

## 2016-03-28 MED ORDER — AMMONIA AROMATIC IN INHA
RESPIRATORY_TRACT | Status: AC
  Filled 2016-03-28: qty 10

## 2016-03-28 MED ORDER — LACTATED RINGERS IV SOLN
INTRAVENOUS | Status: DC | PRN
  Administered 2016-03-28: 07:00:00 via INTRAVENOUS

## 2016-03-28 MED ORDER — SODIUM CHLORIDE 0.9% FLUSH: 3.0000 mL | INTRAVENOUS | Status: DC | PRN

## 2016-03-28 MED ORDER — PHENYLEPHRINE HCL 10 MG/ML IJ SOLN
INTRAMUSCULAR | Status: DC | PRN
  Administered 2016-03-28 (×3): 100 ug via INTRAVENOUS

## 2016-03-28 MED ORDER — COCONUT OIL OIL
1.0000 "application " | TOPICAL_OIL | Status: DC | PRN
  Filled 2016-03-28: qty 120

## 2016-03-28 MED ORDER — SOD CITRATE-CITRIC ACID 500-334 MG/5ML PO SOLN
ORAL | Status: AC
  Administered 2016-03-28: 30 mL via ORAL
  Filled 2016-03-28: qty 15

## 2016-03-28 MED ORDER — ACETAMINOPHEN 325 MG PO TABS
650.0000 mg | ORAL_TABLET | Freq: Four times a day (QID) | ORAL | Status: DC
  Administered 2016-03-28: 650 mg via ORAL

## 2016-03-28 MED ORDER — FENTANYL 2.5 MCG/ML W/ROPIVACAINE 0.2% IN NS 100 ML EPIDURAL INFUSION (ARMC-ANES): 10.0000 mL/h | EPIDURAL | Status: DC

## 2016-03-28 MED ORDER — VARICELLA VIRUS VACCINE LIVE 1350 PFU/0.5ML IJ SUSR
0.5000 mL | Freq: Once | INTRAMUSCULAR | Status: DC
  Filled 2016-03-28: qty 0.5

## 2016-03-28 MED ORDER — NALBUPHINE HCL 10 MG/ML IJ SOLN: 5.0000 mg | INTRAMUSCULAR | Status: DC | PRN

## 2016-03-28 MED ORDER — SODIUM CHLORIDE FLUSH 0.9 % IV SOLN
INTRAVENOUS | Status: AC
  Filled 2016-03-28: qty 40

## 2016-03-28 MED ORDER — DIPHENHYDRAMINE HCL 25 MG PO CAPS: 25.0000 mg | ORAL_CAPSULE | ORAL | Status: DC | PRN

## 2016-03-28 MED ORDER — LIDOCAINE HCL (PF) 1 % IJ SOLN
INTRAMUSCULAR | Status: AC
  Filled 2016-03-28: qty 30

## 2016-03-28 MED ORDER — KETOROLAC TROMETHAMINE 30 MG/ML IJ SOLN
INTRAMUSCULAR | Status: AC
  Administered 2016-03-28: 30 mg via INTRAVENOUS
  Filled 2016-03-28: qty 1

## 2016-03-28 MED ORDER — GENTAMICIN SULFATE 40 MG/ML IJ SOLN: Freq: Three times a day (TID) | INTRAVENOUS | Status: DC

## 2016-03-28 MED ORDER — LIDOCAINE HCL (PF) 1 % IJ SOLN
INTRAMUSCULAR | Status: DC | PRN
  Administered 2016-03-28: 3 mL

## 2016-03-28 MED ORDER — IBUPROFEN 600 MG PO TABS
600.0000 mg | ORAL_TABLET | Freq: Four times a day (QID) | ORAL | Status: DC
  Administered 2016-03-28 – 2016-03-31 (×13): 600 mg via ORAL
  Filled 2016-03-28 (×13): qty 1

## 2016-03-28 MED ORDER — FERROUS SULFATE 325 (65 FE) MG PO TABS
325.0000 mg | ORAL_TABLET | Freq: Two times a day (BID) | ORAL | Status: DC
  Administered 2016-03-28 – 2016-03-31 (×6): 325 mg via ORAL
  Filled 2016-03-28 (×6): qty 1

## 2016-03-28 MED ORDER — MEASLES, MUMPS & RUBELLA VAC ~~LOC~~ INJ
0.5000 mL | INJECTION | Freq: Once | SUBCUTANEOUS | Status: DC
  Filled 2016-03-28: qty 0.5

## 2016-03-28 MED ORDER — OXYCODONE-ACETAMINOPHEN 5-325 MG PO TABS
1.0000 | ORAL_TABLET | ORAL | Status: DC | PRN
  Administered 2016-03-28 (×2): 1 via ORAL
  Filled 2016-03-28 (×2): qty 1

## 2016-03-28 MED ORDER — SENNOSIDES-DOCUSATE SODIUM 8.6-50 MG PO TABS
2.0000 | ORAL_TABLET | ORAL | Status: DC
  Administered 2016-03-28 – 2016-03-30 (×3): 2 via ORAL
  Filled 2016-03-28 (×3): qty 2

## 2016-03-28 MED ORDER — CLINDAMYCIN PHOSPHATE 900 MG/50ML IV SOLN
900.0000 mg | Freq: Three times a day (TID) | INTRAVENOUS | Status: AC
Start: 2016-03-28 — End: 2016-03-29
  Administered 2016-03-28 – 2016-03-29 (×3): 900 mg via INTRAVENOUS
  Filled 2016-03-28 (×3): qty 50

## 2016-03-28 MED ORDER — KETOROLAC TROMETHAMINE 30 MG/ML IJ SOLN: 30.0000 mg | Freq: Four times a day (QID) | INTRAMUSCULAR | Status: DC | PRN

## 2016-03-28 MED ORDER — LIDOCAINE 2% (20 MG/ML) 5 ML SYRINGE
INTRAMUSCULAR | Status: DC | PRN
  Administered 2016-03-28: 80 mg via INTRAVENOUS
  Administered 2016-03-28: 100 mg via INTRAVENOUS
  Administered 2016-03-28 (×2): 60 mg via INTRAVENOUS
  Administered 2016-03-28: 100 mg via INTRAVENOUS

## 2016-03-28 MED ORDER — WITCH HAZEL-GLYCERIN EX PADS: 1.0000 "application " | MEDICATED_PAD | CUTANEOUS | Status: DC | PRN

## 2016-03-28 MED ORDER — ZOLPIDEM TARTRATE 5 MG PO TABS: 5.0000 mg | ORAL_TABLET | Freq: Every evening | ORAL | Status: DC | PRN

## 2016-03-28 MED ORDER — LIDOCAINE 5 % EX PTCH
MEDICATED_PATCH | CUTANEOUS | Status: AC
  Filled 2016-03-28: qty 1

## 2016-03-28 MED ORDER — LACTATED RINGERS IV SOLN
INTRAVENOUS | Status: DC
  Administered 2016-03-29 – 2016-03-30 (×2): via INTRAVENOUS

## 2016-03-28 MED ORDER — KETOROLAC TROMETHAMINE 30 MG/ML IJ SOLN
30.0000 mg | Freq: Four times a day (QID) | INTRAMUSCULAR | Status: DC | PRN
  Administered 2016-03-28: 30 mg via INTRAVENOUS

## 2016-03-28 MED ORDER — MISOPROSTOL 200 MCG PO TABS
ORAL_TABLET | ORAL | Status: AC
  Filled 2016-03-28: qty 4

## 2016-03-28 MED ORDER — ONDANSETRON HCL 4 MG/2ML IJ SOLN: 4.0000 mg | Freq: Three times a day (TID) | INTRAMUSCULAR | Status: DC | PRN

## 2016-03-28 MED ORDER — DIPHENHYDRAMINE HCL 50 MG/ML IJ SOLN: 12.5000 mg | INTRAMUSCULAR | Status: DC | PRN

## 2016-03-28 MED ORDER — SODIUM CHLORIDE 0.9 % IV SOLN
INTRAVENOUS | Status: DC | PRN
  Administered 2016-03-28 (×3): 5 mL via EPIDURAL

## 2016-03-28 MED ORDER — GENTAMICIN SULFATE 40 MG/ML IJ SOLN
5.0000 mg/kg | INTRAVENOUS | Status: AC
  Administered 2016-03-28: 400 mg via INTRAVENOUS
  Filled 2016-03-28: qty 10

## 2016-03-28 MED ORDER — HYDROMORPHONE HCL 1 MG/ML IJ SOLN
INTRAMUSCULAR | Status: DC | PRN
  Administered 2016-03-28: 0.5 mg via INTRAVENOUS

## 2016-03-28 MED ORDER — MEPERIDINE HCL 25 MG/ML IJ SOLN: 6.2500 mg | INTRAMUSCULAR | Status: DC | PRN

## 2016-03-28 MED ORDER — ACETAMINOPHEN 325 MG PO TABS
650.0000 mg | ORAL_TABLET | ORAL | Status: DC | PRN
  Administered 2016-03-28: 15:00:00 via ORAL
  Filled 2016-03-28: qty 2

## 2016-03-28 MED ORDER — SCOPOLAMINE 1 MG/3DAYS TD PT72: 1.0000 | MEDICATED_PATCH | Freq: Once | TRANSDERMAL | Status: DC

## 2016-03-28 MED ORDER — OXYTOCIN 10 UNIT/ML IJ SOLN
INTRAMUSCULAR | Status: AC
  Filled 2016-03-28: qty 2

## 2016-03-28 MED ORDER — LIDOCAINE 5 % EX PTCH
MEDICATED_PATCH | CUTANEOUS | Status: DC | PRN
  Administered 2016-03-28: 1 via TRANSDERMAL

## 2016-03-28 MED ORDER — DIPHENHYDRAMINE HCL 25 MG PO CAPS: 25.0000 mg | ORAL_CAPSULE | Freq: Four times a day (QID) | ORAL | Status: DC | PRN

## 2016-03-28 MED ORDER — DIBUCAINE 1 % RE OINT: 1.0000 "application " | TOPICAL_OINTMENT | RECTAL | Status: DC | PRN

## 2016-03-28 MED ORDER — OXYCODONE-ACETAMINOPHEN 5-325 MG PO TABS: 2.0000 | ORAL_TABLET | ORAL | Status: DC | PRN

## 2016-03-28 MED ORDER — OXYCODONE HCL 5 MG PO TABS
10.0000 mg | ORAL_TABLET | Freq: Four times a day (QID) | ORAL | Status: DC | PRN
  Administered 2016-03-28: 10 mg via ORAL
  Filled 2016-03-28: qty 2

## 2016-03-28 MED ORDER — OXYCODONE HCL 5 MG PO TABS
5.0000 mg | ORAL_TABLET | ORAL | Status: DC | PRN
  Administered 2016-03-29 – 2016-03-30 (×3): 5 mg via ORAL
  Filled 2016-03-28 (×3): qty 1

## 2016-03-28 MED ORDER — MENTHOL 3 MG MT LOZG
1.0000 | LOZENGE | OROMUCOSAL | Status: DC | PRN
  Filled 2016-03-28: qty 9

## 2016-03-28 MED ORDER — CLINDAMYCIN PHOSPHATE 900 MG/50ML IV SOLN: 900.0000 mg | INTRAVENOUS | Status: DC

## 2016-03-28 MED ORDER — OXYTOCIN 40 UNITS IN LACTATED RINGERS INFUSION - SIMPLE MED: 2.5000 [IU]/h | INTRAVENOUS | Status: AC

## 2016-03-28 MED ORDER — NALBUPHINE HCL 10 MG/ML IJ SOLN: 5.0000 mg | Freq: Once | INTRAMUSCULAR | Status: DC | PRN

## 2016-03-28 MED ORDER — OXYCODONE HCL 5 MG PO TABS: 5.0000 mg | ORAL_TABLET | ORAL | Status: DC | PRN

## 2016-03-28 MED ORDER — SIMETHICONE 80 MG PO CHEW
80.0000 mg | CHEWABLE_TABLET | ORAL | Status: DC
  Administered 2016-03-28 – 2016-03-30 (×3): 80 mg via ORAL
  Filled 2016-03-28 (×3): qty 1

## 2016-03-28 MED ORDER — MAGNESIUM HYDROXIDE 400 MG/5ML PO SUSP
30.0000 mL | ORAL | Status: DC | PRN
  Filled 2016-03-28: qty 30

## 2016-03-28 MED ORDER — LIDOCAINE 5 % EX PTCH
1.0000 | MEDICATED_PATCH | CUTANEOUS | Status: DC
  Administered 2016-03-29 – 2016-03-31 (×3): 1 via TRANSDERMAL
  Filled 2016-03-28 (×3): qty 1

## 2016-03-28 MED ORDER — DEXTROSE 5 % IV SOLN
1.0000 ug/kg/h | INTRAVENOUS | Status: DC | PRN
  Filled 2016-03-28: qty 2

## 2016-03-28 MED ORDER — OXYTOCIN 40 UNITS IN LACTATED RINGERS INFUSION - SIMPLE MED
INTRAVENOUS | Status: DC | PRN
  Administered 2016-03-28: 500 mL via INTRAVENOUS

## 2016-03-28 MED ORDER — ACETAMINOPHEN 500 MG PO TABS
1000.0000 mg | ORAL_TABLET | Freq: Four times a day (QID) | ORAL | Status: DC | PRN
  Administered 2016-03-28: 500 mg via ORAL
  Filled 2016-03-28 (×2): qty 2

## 2016-03-28 MED ORDER — GENTAMICIN SULFATE 40 MG/ML IJ SOLN
5.0000 mg/kg | INTRAMUSCULAR | Status: AC
  Administered 2016-03-29: 320 mg via INTRAVENOUS
  Filled 2016-03-28 (×2): qty 8

## 2016-03-28 MED ORDER — FENTANYL 2.5 MCG/ML W/ROPIVACAINE 0.2% IN NS 100 ML EPIDURAL INFUSION (ARMC-ANES)
EPIDURAL | Status: DC | PRN
  Administered 2016-03-28: 10 mL/h via EPIDURAL

## 2016-03-28 MED ORDER — PRENATAL MULTIVITAMIN CH
1.0000 | ORAL_TABLET | Freq: Every day | ORAL | Status: DC
  Administered 2016-03-28 – 2016-03-31 (×3): 1 via ORAL
  Filled 2016-03-28 (×5): qty 1

## 2016-03-28 MED ORDER — SIMETHICONE 80 MG PO CHEW: 80.0000 mg | CHEWABLE_TABLET | ORAL | Status: DC | PRN

## 2016-03-28 SURGICAL SUPPLY — 27 items
BAG COUNTER SPONGE EZ (MISCELLANEOUS) ×2 IMPLANT
BENZOIN TINCTURE PRP APPL 2/3 (GAUZE/BANDAGES/DRESSINGS) ×3 IMPLANT
CANISTER SUCT 3000ML (MISCELLANEOUS) ×3 IMPLANT
CHLORAPREP W/TINT 26ML (MISCELLANEOUS) ×6 IMPLANT
CLOSURE WOUND 1/2 X4 (GAUZE/BANDAGES/DRESSINGS) ×1
COUNTER SPONGE BAG EZ (MISCELLANEOUS) ×1
DRESSING TELFA 4X3 1S ST N-ADH (GAUZE/BANDAGES/DRESSINGS) ×3 IMPLANT
DRSG TELFA 3X8 NADH (GAUZE/BANDAGES/DRESSINGS) ×3 IMPLANT
ELECT REM PT RETURN 9FT ADLT (ELECTROSURGICAL) ×3
ELECTRODE REM PT RTRN 9FT ADLT (ELECTROSURGICAL) ×1 IMPLANT
GAUZE SPONGE 4X4 12PLY STRL (GAUZE/BANDAGES/DRESSINGS) ×3 IMPLANT
GLOVE BIO SURGEON STRL SZ 6 (GLOVE) ×3 IMPLANT
GLOVE BIOGEL PI IND STRL 6.5 (GLOVE) ×1 IMPLANT
GLOVE BIOGEL PI INDICATOR 6.5 (GLOVE) ×2
GOWN STRL REUS W/ TWL LRG LVL3 (GOWN DISPOSABLE) ×2 IMPLANT
GOWN STRL REUS W/TWL LRG LVL3 (GOWN DISPOSABLE) ×4
KIT RM TURNOVER STRD PROC AR (KITS) ×3 IMPLANT
NS IRRIG 1000ML POUR BTL (IV SOLUTION) ×3 IMPLANT
PACK C SECTION AR (MISCELLANEOUS) ×3 IMPLANT
PAD OB MATERNITY 4.3X12.25 (PERSONAL CARE ITEMS) ×3 IMPLANT
PAD PREP 24X41 OB/GYN DISP (PERSONAL CARE ITEMS) ×3 IMPLANT
STRIP CLOSURE SKIN 1/2X4 (GAUZE/BANDAGES/DRESSINGS) ×2 IMPLANT
SUT MNCRL AB 4-0 PS2 18 (SUTURE) ×3 IMPLANT
SUT PLAIN 2 0 XLH (SUTURE) IMPLANT
SUT VIC AB 0 CT1 36 (SUTURE) ×12 IMPLANT
SUT VIC AB 3-0 SH 27 (SUTURE) ×2
SUT VIC AB 3-0 SH 27X BRD (SUTURE) ×1 IMPLANT

## 2016-03-28 NOTE — Progress Notes (Signed)
Contacted by midwife to review fetal tracing.  Patient is a 40 y.o. G2P0101 at 462w3d with PPPROM (ruptured since 03/24/2016) at 31w6 days.  Induction was begun yesterday evening due to onset of chorioamnionitis.  Patient's tracing now with repetitive variables (shallow), tachycardia (175 bpm), and periods of minimal to moderate variability.  Patient has had adequate MVU's for now a total of 6 hours with no cervical change (1.5 cm).  Fetus cephalic but now OOP.  At this time my recommendations would be to proceed with primary C-section.  Discussion had with patient regarding risks vs benefits of procedure.  Of note, patient is a TEFL teacherJehovah's witness and does not accept blood products (including whole blood, however will accept the use of fractions, i.e. albumin, hemophiliac preparations, immunoglobulins), see detailed consent form in chart.  Most recent labs noted (with likely gestational thrombocytopenia, and mild anemia).    CBC Latest Ref Rng & Units 03/28/2016 03/27/2016 03/25/2016  WBC 3.6 - 11.0 K/uL 14.9(H) 11.6(H) 16.0(H)  Hemoglobin 12.0 - 16.0 g/dL 10.4(L) 9.9(L) 11.7(L)  Hematocrit 35.0 - 47.0 % 30.6(L) 29.5(L) 34.5(L)  Platelets 150 - 440 K/uL 109(L) 113(L) 144(L)    Will plan to proceed with primary C-section at this time. All questions answered.     Hildred LaserAnika Maribelle Hopple, MD [facility]

## 2016-03-28 NOTE — Progress Notes (Signed)
Morgan Pham is a 40 y.o. G2P0101 at 516w3d by LMP admitted for PROM  Subjective: Denies any pain at this time, epidural in place  Objective: BP 124/79   Pulse 96   Temp 99.7 F (37.6 C) (Oral)   Resp 20   Ht 5\' 4"  (1.626 m)   Wt 175 lb (79.4 kg)   LMP 08/14/2015   SpO2 97%   BMI 30.04 kg/m  I/O last 3 completed shifts: In: 1690 [P.O.:240; Other:1400; IV Piggyback:50] Out: 2050 [Urine:2050] Total I/O In: 456.9 [I.V.:56.9; Other:200; IV Piggyback:200] Out: 350 [Urine:350]  FHT:  FHR: 180 bpm, variability: minimal ,  accelerations:  Present,  decelerations:  Present variable; Dr Valentino Saxonherry aware and reviewed strip; agrees with plan of care and discussed mode of delivery with patient. UC:   regular, every 4-5 minutes, pitocin now off x 40 minutes SVE:   1.5/70/OOP, IUPC removed, unable to palpated presenting part Labs: Lab Results  Component Value Date   WBC 14.9 (H) 03/28/2016   HGB 10.4 (L) 03/28/2016   HCT 30.6 (L) 03/28/2016   MCV 88.4 03/28/2016   PLT 109 (L) 03/28/2016    Assessment / Plan: fetal tachycardia, protracted induction, PPROM  Labor: not progressing after 6 hours of  adequate contractions Preeclampsia:  labs stable Fetal Wellbeing:  Category II Pain Control:  Epidural I/D:  n/a Anticipated MOD:  primary [employer] CompanyLTCS  Castle Lamons N Susy Placzek, CNM 03/28/2016, 6:29 AM

## 2016-03-28 NOTE — Progress Notes (Signed)
Morgan Pham is a 40 y.o. G2P0101 at 369w3d by LMP admitted for PROM  Subjective: Was sleeping through contractio0n upon entering room, and now breathing with them  Objective: BP 112/77 (BP Location: Left Arm)   Pulse 88   Temp 99.4 F (37.4 C) (Oral)   Resp 20   Ht 5\' 4"  (1.626 m)   Wt 175 lb (79.4 kg)   LMP 08/14/2015   SpO2 97%   BMI 30.04 kg/m  I/O last 3 completed shifts: In: 1690 [P.O.:240; Other:1400; IV Piggyback:50] Out: 2050 [Urine:2050] Total I/O In: -  Out: 350 [Urine:350]  FHT:  FHR: 170 bpm, variability: minimal ,  accelerations:  Present,  decelerations:  Present variable UC:   regular, every 2-4 minutes, mild to palpation, on 6 mu/min pitocin, IUPC placed for amnioinfusion SVE:   Dilation: 1 Effacement (%): 50 Station: -2 Exam by:: Morgan Pham  Labs: Lab Results  Component Value Date   WBC 11.6 (H) 03/27/2016   HGB 9.9 (L) 03/27/2016   HCT 29.5 (L) 03/27/2016   MCV 89.6 03/27/2016   PLT 113 (L) 03/27/2016    Assessment / Plan:PPROM with chorioamnionitis Induction of labor due to PROM,  progressing well on pitocin  Labor: Progressing normally Preeclampsia:  labs stable Fetal Wellbeing:  Category II Pain Control:  Labor support without medications I/D:  n/a Anticipated MOD:  NSVD  Morgan Pham Morgan Pham Morgan Pham, CNM 03/28/2016, 12:52 AM

## 2016-03-28 NOTE — Anesthesia Procedure Notes (Signed)
Epidural Patient location during procedure: OB Start time: 03/28/2016 2:25 AM End time: 03/28/2016 2:49 AM  Staffing Anesthesiologist: Alver FisherPENWARDEN, Braedon Sjogren Performed: anesthesiologist   Preanesthetic Checklist Completed: patient identified, site marked, surgical consent, pre-op evaluation, timeout performed, IV checked, risks and benefits discussed and monitors and equipment checked  Epidural Patient position: sitting Prep: ChloraPrep Patient monitoring: heart rate, continuous pulse ox and blood pressure Approach: midline Location: L4-L5 Injection technique: LOR saline  Needle:  Needle type: Tuohy  Needle gauge: 18 G Needle length: 9 cm and 9 Needle insertion depth: 6 cm Catheter type: closed end flexible Catheter size: 20 Guage Catheter at skin depth: 10 cm Test dose: negative (5mL 0.125% bupivacaine)  Assessment Events: blood not aspirated, injection not painful, no injection resistance, negative IV test and no paresthesia  Additional Notes   Patient tolerated the insertion well without complications.Reason for block:procedure for pain

## 2016-03-28 NOTE — Transfer of Care (Signed)
Immediate Anesthesia Transfer of Care Note  Patient: Morgan Pham  Procedure(s) Performed: Procedure(s): CESAREAN SECTION (N/A)  Patient Location: [facility] Unit  Anesthesia Type:Epidural  Level of Consciousness: awake, alert  and oriented  Airway & Oxygen Therapy: Patient Spontanous Breathing  Post-op Assessment: Report given to RN and Post -op Vital signs reviewed and stable  Post vital signs: Reviewed  Last Vitals:  Vitals:   03/28/16 0627 03/28/16 0802  BP: (!) 142/84 126/79  Pulse: (!) 124 (!) 107  Resp:  16  Temp:  37.8 C    Last Pain:  Vitals:   03/28/16 0500  TempSrc: Axillary  PainSc:          Complications: No apparent anesthesia complications

## 2016-03-28 NOTE — Op Note (Signed)
Cesarean Section Procedure Note  Indications: chorioamnionitis, failed induction and PPPROM, Category II tracing  Pre-operative Diagnosis: 32 week 3 day pregnancy, prolonged PPROM, chorioamnionitis, failed induction, Category II tracing.  Post-operative Diagnosis: same  Surgeon: Hildred LaserAnika Kamron Portee, MD  Assistants: Harlow MaresMelody Shambley, CNM  Procedure: Primary low transverse Cesarean Section  Anesthesia: Epidural anesthesia  Procedure Details: The patient was seen in the Holding Room. The risks, benefits, complications, treatment options, and expected outcomes were discussed with the patient.  The patient concurred with the proposed plan, giving informed consent.  The site of surgery properly noted/marked. The patient was taken to the Operating Room, identified as Morgan Pham and the procedure verified as C-Section Delivery.  The patient previously had an epidural placed during the induction process.  The patient was draped and prepped in the usual sterile manner. Anesthesia was tested and noted to be adequate.  A Time Out was held and the above information confirmed.  A Pfannenstiel incision was made and carried down through the subcutaneous tissue to the fascia. Fascial incision was made and extended transversely. The fascia was separated from the underlying rectus tissue superiorly and inferiorly. The peritoneum was identified and entered. Peritoneal incision was extended longitudinally. The utero-vesical peritoneal reflection was incised transversely and the bladder flap was bluntly freed from the lower uterine segment. A low transverse uterine incision was made. Delivered from cephalic presentation was a 1920 gram Female with Apgar scores of 8 at one minute and 9 at five minutes.  A body cord x 2 and nuchal cord x 1 was reduced. After the umbilical cord was clamped and cut, cord blood was obtained for evaluation. The placenta was removed intact and appeared normal. The uterus was exteriorized and cleared  of all clots and debris. The uterine outline, tubes and ovaries appeared normal.  The uterine incision was closed with running locked sutures of 0-Vicryl.  A second suture of 0-Vicryl was used in an imbricating layer.  The bladder flap was approximated using a 3-0 Vicryl in a running fashion for hemostasis.  Hemostasis was observed. The uterus was returned to the abdomen. Lavage was carried out until clear. The fascia was then reapproximated with a running suture of 0-Vicryl. The subcutaneous fat layer was reapproximated with 3-0 Vicryl in a running fashion. The skin was approximated with 4-0 Monocryl.  Instrument, sponge, and needle counts were correct prior the abdominal closure and at the conclusion of the case.   Findings: Female/Female infant, cephalic presentation, 1920 grams, with Apgar scores of 8 at one minute and 9 at five minutes. Intact placenta with 3 vessel cord.  A body cord x 2 and nuchal cord x 1 was present at delivery. The uterine outline, tubes and ovaries appeared normal.   Estimated Blood Loss:  700 ml      Drains: foley catheter to gravity drainage, 100 ml clear urine at end of the procedure         Total IV Fluids:  1000 ml  Specimens: Placenta and Disposition:  Sent to Pathology         Implants: None         Complications:  None; patient tolerated the procedure well.         Disposition: PACU - hemodynamically stable.         Condition: stable   Hildred LaserAnika Patrisia Faeth, MD [facility]

## 2016-03-28 NOTE — Anesthesia Preprocedure Evaluation (Signed)
Anesthesia Evaluation  Patient identified by MRN, date of birth, ID band Patient awake    Reviewed: Allergy & Precautions, NPO status , Patient's Chart, lab work & pertinent test results  History of Anesthesia Complications Negative for: history of anesthetic complications  Airway Mallampati: II  TM Distance: >3 FB Neck ROM: Full    Dental no notable dental hx.    Pulmonary asthma (mild intermittent, does not even have an inhaler prescribed at this time) ,    breath sounds clear to auscultation- rhonchi (-) wheezing      Cardiovascular Exercise Tolerance: Good (-) hypertension(-) CAD and (-) Past MI  Rhythm:Regular Rate:Normal - Systolic murmurs and - Diastolic murmurs    Neuro/Psych negative neurological ROS  negative psych ROS   GI/Hepatic negative GI ROS, Neg liver ROS,   Endo/Other  negative endocrine ROSneg diabetes  Renal/GU negative Renal ROS     Musculoskeletal negative musculoskeletal ROS (+)   Abdominal Gravid abdomen   Peds  Hematology negative hematology ROS (+)   Anesthesia Other Findings   Reproductive/Obstetrics (+) Pregnancy                             Anesthesia Physical Anesthesia Plan  ASA: II  Anesthesia Plan: Epidural   Post-op Pain Management:    Induction:   Airway Management Planned:   Additional Equipment:   Intra-op Plan:   Post-operative Plan:   Informed Consent: I have reviewed the patients History and Physical, chart, labs and discussed the procedure including the risks, benefits and alternatives for the proposed anesthesia with the patient or authorized representative who has indicated his/her understanding and acceptance.     Plan Discussed with: Anesthesiologist  Anesthesia Plan Comments: (Plan for epidural for labor. Discussed use of epidural vs spinal vs GA if need for CS.)        Anesthesia Quick Evaluation

## 2016-03-29 ENCOUNTER — Encounter: Payer: Self-pay | Admitting: Obstetrics and Gynecology

## 2016-03-29 DIAGNOSIS — O99119 Other diseases of the blood and blood-forming organs and certain disorders involving the immune mechanism complicating pregnancy, unspecified trimester: Secondary | ICD-10-CM

## 2016-03-29 DIAGNOSIS — D696 Thrombocytopenia, unspecified: Secondary | ICD-10-CM

## 2016-03-29 DIAGNOSIS — O411290 Chorioamnionitis, unspecified trimester, not applicable or unspecified: Secondary | ICD-10-CM | POA: Diagnosis not present

## 2016-03-29 LAB — CBC
HCT: 27.9 % — ABNORMAL LOW (ref 35.0–47.0)
Hemoglobin: 9.6 g/dL — ABNORMAL LOW (ref 12.0–16.0)
MCH: 30.7 pg (ref 26.0–34.0)
MCHC: 34.6 g/dL (ref 32.0–36.0)
MCV: 88.9 fL (ref 80.0–100.0)
PLATELETS: 105 10*3/uL — AB (ref 150–440)
RBC: 3.14 MIL/uL — AB (ref 3.80–5.20)
RDW: 13.9 % (ref 11.5–14.5)
WBC: 16.2 10*3/uL — AB (ref 3.6–11.0)

## 2016-03-29 LAB — STREP GP B SUSCEPTIBILITY

## 2016-03-29 LAB — STREP GP B NAA+RFLX: STREP GP B NAA+RFLX: POSITIVE — AB

## 2016-03-29 MED ORDER — AMMONIA AROMATIC IN INHA
RESPIRATORY_TRACT | Status: AC
  Filled 2016-03-29: qty 10

## 2016-03-29 NOTE — Progress Notes (Signed)
Postpartum Day # 1: Cesarean Delivery  Subjective: Patient reports tolerating PO and no problems voiding.  Denies nausea/vomiting.  Notes pain controlled with medications.   Objective: Vital signs in last 24 hours: Temp:  [97.5 F (36.4 C)-101.2 F (38.4 C)] 97.6 F (36.4 C) (08/14 0745) Pulse Rate:  [79-105] 79 (08/14 0745) Resp:  [14-20] 18 (08/14 0745) BP: (96-123)/(50-76) 107/73 (08/14 0745) SpO2:  [94 %-99 %] 97 % (08/14 0745)  Physical Exam:  General: alert and no distress Lungs: clear to auscultation bilaterally Breasts: normal appearance, no masses or tenderness Heart: regular rate and rhythm, S1, S2 normal, no murmur, click, rub or gallop Pelvis: Lochia appropriate, Uterine Fundus firm, Incision: healing well, no significant drainage, no dehiscence, no significant erythema, slow healing Extremities: DVT Evaluation: Negative Homan's sign. No cords or calf tenderness. No significant calf/ankle edema.  CBC Latest Ref Rng & Units 03/29/2016 03/28/2016 03/27/2016  WBC 3.6 - 11.0 K/uL 16.2(H) 14.9(H) 11.6(H)  Hemoglobin 12.0 - 16.0 g/dL 5.4(U9.6(L) 10.4(L) 9.9(L)  Hematocrit 35.0 - 47.0 % 27.9(L) 30.6(L) 29.5(L)  Platelets 150 - 440 K/uL 105(L) 109(L) 113(L)    Assessment/Plan: Status post Cesarean section. Doing well postoperatively.  Breast pumping, infant in NICU for prematurity. Advance diet Continue PO pain management Discontinue IVF once final dose of antibiotics completed, and tolerating regular diet. Chorioamnionitis, last temp at 1700, 101.2 degrees.  Currently on Clindamycin and Gentamycin for 24 hrs post-op. Continue current care. Gestational thrombocytopenia, will f/u postpartum.  To discuss contraception postpartum.   Hildred LaserAnika Amyah Pham [facility]

## 2016-03-29 NOTE — Progress Notes (Signed)
Subjective: Post Op Day 1 Day Post-Op: Cesarean Delivery Patient reports incisional pain, tolerating PO, + flatus, no problems voiding, ambulating and Breastfeeding.    Objective: Vital signs in last 24 hours: Temp:  [97.5 F (36.4 C)-101.2 F (38.4 C)] 98 F (36.7 C) (08/14 1158) Pulse Rate:  [79-105] 81 (08/14 1158) Resp:  [18-20] 18 (08/14 1158) BP: (100-123)/(64-76) 100/68 (08/14 1158) SpO2:  [94 %-99 %] 97 % (08/14 0745)  Physical Exam:  General: alert, cooperative and appears stated age Lochia: appropriate Uterine Fundus: firm Incision: healing well, no significant drainage DVT Evaluation: No evidence of DVT seen on physical exam. Negative Homan's sign.   Recent Labs  03/28/16 0143 03/29/16 0425  HGB 10.4* 9.6*  HCT 30.6* 27.9*     Assessment/Plan: Status post Cesarean section. Doing well postoperatively.  Continue current care.  Jermya Dowding [employer] Afrika Brick, CNM 03/29/2016, 2:03 PM

## 2016-03-29 NOTE — Anesthesia Postprocedure Evaluation (Signed)
Anesthesia Post Note  Patient: Morgan Pham  Procedure(s) Performed: Procedure(s) (LRB): CESAREAN SECTION (N/A)  Patient location during evaluation: Mother Baby Anesthesia Type: Epidural Level of consciousness: awake, awake and alert and oriented Pain management: pain level controlled Vital Signs Assessment: post-procedure vital signs reviewed and stable Respiratory status: spontaneous breathing, nonlabored ventilation and respiratory function stable Cardiovascular status: blood pressure returned to baseline and stable Postop Assessment: no headache and no backache Anesthetic complications: no    Last Vitals:  Vitals:   03/29/16 0330 03/29/16 0415  BP: 104/66   Pulse: 92   Resp: 20   Temp: 36.4 C 36.4 C    Last Pain:  Vitals:   03/29/16 0434  TempSrc:   PainSc: Asleep                 Ginger CarneStephanie Kirsta Probert

## 2016-03-30 LAB — RUBELLA SCREEN

## 2016-03-30 LAB — VARICELLA ZOSTER ANTIBODY, IGG: Varicella IgG: 529 index (ref >165)

## 2016-03-30 LAB — SURGICAL PATHOLOGY

## 2016-03-30 NOTE — Progress Notes (Signed)
Subjective: Post Op Day 2 Days Post-Op: Cesarean Delivery Patient reports incisional pain, + flatus, + BM, no problems voiding, ambulating and Breastfeeding.    Objective: Vital signs in last 24 hours: Temp:  [98 F (36.7 C)-98.7 F (37.1 C)] 98.7 F (37.1 C) (08/15 1201) Pulse Rate:  [88-98] 97 (08/15 1201) Resp:  [18] 18 (08/15 1201) BP: (101-109)/(59-69) 101/59 (08/15 1201) SpO2:  [98 %-99 %] 99 % (08/15 [id number])  Physical Exam:  General: alert, cooperative and appears stated age Lochia: appropriate Uterine Fundus: firm Incision: healing well, no significant drainage DVT Evaluation: No evidence of DVT seen on physical exam. Negative Homan's sign.   Recent Labs  03/28/16 0143 03/29/16 0425  HGB 10.4* 9.6*  HCT 30.6* 27.9*     Assessment/Plan: Status post Cesarean section. Doing well postoperatively.  Continue current care.  Kaenan Jake [employer] Woods Gangemi, CNM 03/30/2016, 12:03 PM

## 2016-03-31 MED ORDER — NORETHINDRONE 0.35 MG PO TABS: 1.0000 | ORAL_TABLET | Freq: Every day | ORAL | 11 refills | Status: DC

## 2016-03-31 MED ORDER — OXYCODONE HCL 5 MG PO TABS: 5.0000 mg | ORAL_TABLET | ORAL | 0 refills | Status: DC | PRN

## 2016-03-31 MED ORDER — IBUPROFEN 600 MG PO TABS: 600.0000 mg | ORAL_TABLET | Freq: Four times a day (QID) | ORAL | 0 refills | Status: DC

## 2016-03-31 MED ORDER — VITAMIN D3 125 MCG (5000 UT) PO CAPS: 1.0000 | ORAL_CAPSULE | Freq: Every day | ORAL | 2 refills | Status: DC

## 2016-03-31 MED ORDER — FUSION PLUS PO CAPS: 1.0000 | ORAL_CAPSULE | Freq: Every day | ORAL | 1 refills | Status: DC

## 2016-03-31 MED ORDER — DOCUSATE SODIUM 100 MG PO CAPS: 100.0000 mg | ORAL_CAPSULE | Freq: Two times a day (BID) | ORAL | 2 refills | Status: DC | PRN

## 2016-03-31 NOTE — Discharge Summary (Signed)
Obstetric Discharge Summary Reason for Admission: rupture of membranes Prenatal Procedures: ultrasound Intrapartum Procedures: cesarean: low cervical, transverse and GBS prophylaxis Postpartum Procedures: Rubella Ig and varicella vaccine Complications-Operative and Postpartum: none Hemoglobin  Date Value Ref Range Status  03/29/2016 9.6 (L) 12.0 - 16.0 g/dL Final   HCT  Date Value Ref Range Status  03/29/2016 27.9 (L) 35.0 - 47.0 % Final   Hematocrit  Date Value Ref Range Status  03/05/2016 35.8 34.0 - 46.6 % Final    Physical Exam:  General: alert, cooperative and appears stated age Lochia: appropriate Uterine Fundus: firm Incision: healing well, no significant drainage DVT Evaluation: No evidence of DVT seen on physical exam. Negative Homan's sign. Calf/Ankle edema is present.  Discharge Diagnoses: Premature labor and PPROM at 9431w5d, with chorioamnionitis and fetal intolerance to labor, primary LTCS delivery  Discharge Information: Date: 03/31/2016 Activity: pelvic rest Diet: routine Medications: PNV, Tylenol #3, Ibuprofen, Colace, Iron and Camila OCP Condition: stable Instructions: refer to practice specific booklet Discharge to: home   Newborn Data: Live born female  Birth Weight: 4 lb 3.7 oz (1920 g) APGAR: 8, 9  Home with to remain in [facility] .  Melody [employer] Shambley, CNM 03/31/2016, 8:38 AM

## 2016-03-31 NOTE — Progress Notes (Signed)
Pt discharged home.  Discharge instructions and follow up appointment given to and reviewed with pt.  Pt verbalized understanding.  Escorted by auxillary. 

## 2016-04-02 ENCOUNTER — Encounter: Payer: 59 | Admitting: Obstetrics and Gynecology

## 2016-04-05 ENCOUNTER — Ambulatory Visit (INDEPENDENT_AMBULATORY_CARE_PROVIDER_SITE_OTHER): Payer: 59 | Admitting: Obstetrics and Gynecology

## 2016-04-05 ENCOUNTER — Encounter: Payer: Self-pay | Admitting: Obstetrics and Gynecology

## 2016-04-05 VITALS — BP 110/79 | HR 88 | Ht 64.0 in | Wt 159.0 lb

## 2016-04-05 DIAGNOSIS — Z98891 History of uterine scar from previous surgery: Secondary | ICD-10-CM

## 2016-04-05 NOTE — Patient Instructions (Signed)
  Place gynecology postoperative visit patient instructions here.  Thank you for enrolling in MyChart. Please follow the instructions below to securely access your online medical record. MyChart allows you to send messages to your doctor, view your test results, manage appointments, and more.   How Do I Sign Up? 1. In your Internet browser, go to [employer] Address Bar and enter [email or web address]. 2. Click on the Sign Up Now link in the Sign In box. You will see the New Member Sign Up page. 3. Enter your MyChart Access Code exactly as it appears below. You will not need to use this code after you've completed the sign-up process. If you do not sign up before the expiration date, you must request a new code.  MyChart Access Code: [email or web address] Expires: 05/23/2016  2:48 PM  4. Enter your Social Security Number ([id number]) and Date of Birth (mm/dd/yyyy) as indicated and click Submit. You will be taken to the next sign-up page. 5. Create a MyChart ID. This will be your MyChart login ID and cannot be changed, so think of one that is secure and easy to remember. 6. Create a MyChart password. You can change your password at any time. 7. Enter your Password Reset Question and Answer. This can be used at a later time if you forget your password.  8. Enter your e-mail address. You will receive e-mail notification when new information is available in MyChart. 9. Click Sign Up. You can now view your medical record.   Additional Information Remember, MyChart is NOT to be used for urgent needs. For medical emergencies, dial 911.

## 2016-04-05 NOTE — Progress Notes (Signed)
  Subjective:     Morgan Pham is a 40 y.o. female who presents to the clinic 1 weeks status post LTCS for PPROM and fetal intolerance of labor. Eating a regular diet without difficulty. Bowel movements are normal. Pain is controlled with current analgesics. Medications being used: ibuprofen (OTC) and narcotic analgesics including oxycodone/acetaminophen (Percocet, Tylox).  The following portions of the patient's history were reviewed and updated as appropriate: allergies, current medications, past family history, past medical history, past social history, past surgical history and problem list.  Review of Systems A comprehensive review of systems was negative.    Objective:    BP 110/79   Pulse 88   Ht 5\' 4"  (1.626 m)   Wt 159 lb (72.1 kg)   Breastfeeding? Yes   BMI 27.29 kg/m  General:  alert, cooperative and appears stated age  Abdomen: soft, bowel sounds active, non-tender  Incision:   healing well, no drainage, no erythema, no hernia, no seroma, no swelling, no dehiscence, incision well approximated    infant remains in SCN- doing well Assessment:    Doing well postoperatively. Operative findings again reviewed. Pathology report discussed.    Plan:    1. Continue any current medications. 2. Wound care discussed. 3. Activity restrictions: none 4. Anticipated return to work: not applicable. 5. Follow up: 5 weeks for postpartum visit.

## 2016-04-23 ENCOUNTER — Encounter: Payer: 59 | Admitting: Obstetrics and Gynecology

## 2016-04-29 ENCOUNTER — Telehealth: Payer: Self-pay | Admitting: Obstetrics and Gynecology

## 2016-04-29 NOTE — Telephone Encounter (Signed)
Yes I want her to continue vitamins until her six week check up.

## 2016-04-29 NOTE — Telephone Encounter (Signed)
PT CALLED AND SHE WAS GIVEN A RX FOR VITAMINS, VITAMINS AND IRON AND OTHER, IT IS WHAT [facility] PRESCRIBED HER WHEN SHE LEFT THE HOSPITAL, SHE WASN'T SURE IF SHE NEEDS TO CONTINUE TAKING THEM, HE DOES HAVE REFILLS ON THEM BUT SHE WASN'T SURE IF SHE NEEDS TO CONTINUE TAKING THEM OR WHAT UNTIL SHE SEE HER IN [facility], PT WOULD LIKE A CALL BACK.

## 2016-04-29 NOTE — Telephone Encounter (Signed)
pls advise

## 2016-04-29 NOTE — Telephone Encounter (Signed)
Notified pt she voiced understanding 

## 2016-04-30 ENCOUNTER — Encounter: Payer: 59 | Admitting: Obstetrics and Gynecology

## 2016-05-07 ENCOUNTER — Encounter: Payer: 59 | Admitting: Obstetrics and Gynecology

## 2016-05-14 ENCOUNTER — Encounter: Payer: 59 | Admitting: Obstetrics and Gynecology

## 2016-05-19 ENCOUNTER — Encounter: Payer: Self-pay | Admitting: Obstetrics and Gynecology

## 2016-05-19 ENCOUNTER — Ambulatory Visit (INDEPENDENT_AMBULATORY_CARE_PROVIDER_SITE_OTHER): Payer: 59 | Admitting: Obstetrics and Gynecology

## 2016-05-19 NOTE — Patient Instructions (Addendum)
  Place postpartum visit patient instructions here.   Thank you for enrolling in MyChart. Please follow the instructions below to securely access your online medical record. MyChart allows you to send messages to your doctor, view your test results, renew your prescriptions, schedule appointments, and more.  How Do I Sign Up? 1. In your Internet browser, go to [email or web address]. 2. Click on the New  User? link in the Sign In box.  3. Enter your MyChart Access Code exactly as it appears below. You will not need to use this code after you have completed the sign-up process. If you do not sign up before the expiration date, you must request a new code. MyChart Access Code: [email or web address] Expires: 05/23/2016  2:48 PM  4. Enter the last four digits of your Social Security Number (xxxx) and Date of Birth (mm/dd/yyyy) as indicated and click Next. You will be taken to the next sign-up page. 5. Create a MyChart ID. This will be your MyChart login ID and cannot be changed, so think of one that is secure and easy to remember. 6. Create a MyChart password. You can change your password at any time. 7. Enter your Password Reset Question and Answer and click Next. This can be used at a later time if you forget your password.  8. Select your communication preference, and if applicable enter your e-mail address. You will receive e-mail notification when new information is available in MyChart by choosing to receive e-mail notifications and filling in your e-mail. 9. Click Sign In. You can now view your medical record.   Additional Information If you have questions, you can email [email or web address] or call [phone] to talk to our MyChart staff. Remember, MyChart is NOT to be used for urgent needs. For medical emergencies, dial 911.

## 2016-05-19 NOTE — Progress Notes (Signed)
   Subjective:     Morgan Pham is a 40 y.o. female who presents for a postpartum visit. She is 6 weeks postpartum following a low cervical transverse Cesarean section. I have fully reviewed the prenatal and intrapartum course. The delivery was at 33 gestational weeks. Outcome: primary cesarean section, low transverse incision. Anesthesia: epidural. Postpartum course has been uncomplicated. Baby's course has been complicated by prematurity and feeding issues, but at home and doing well now.Pecola Leisure. Baby is feeding by breast. Bleeding staining only. Bowel function is normal. Bladder function is normal. Patient is not sexually active. Contraception method is abstinence. Postpartum depression screening: negative.  The following portions of the patient's history were reviewed and updated as appropriate: allergies, current medications, past family history, past medical history, past social history, past surgical history and problem list.  Review of Systems A comprehensive review of systems was negative.   Objective:    BP 105/73   Pulse 61   Ht 5\' 4"  (1.626 m)   Wt 148 lb 11.2 oz (67.4 kg)   Breastfeeding? Yes   BMI 25.52 kg/m   General:  alert, cooperative and appears stated age   Breasts:  inspection negative, no nipple discharge or bleeding, no masses or nodularity palpable  Lungs: clear to auscultation bilaterally  Heart:  regular rate and rhythm, S1, S2 normal, no murmur, click, rub or gallop  Abdomen: soft, non-tender; bowel sounds normal; no masses,  no organomegaly   Vulva:  normal  Vagina: normal vagina, no discharge, exudate, lesion, or erythema  Cervix:  multiparous appearance and scant pink discharge  Corpus: normal size, contour, position, consistency, mobility, non-tender  Adnexa:  normal adnexa and no mass, fullness, tenderness  Rectal Exam: Not performed.       C/s incision well healed Assessment:     6 weeks postpartum exam. Pap smear not done at today's visit.   Plan:    1.  Contraception: oral progesterone-only contraceptive 2. BTB on progestine only OCP 3. Follow up in: 4 months or as needed.

## 2016-05-20 ENCOUNTER — Telehealth: Payer: Self-pay | Admitting: *Deleted

## 2016-05-20 LAB — CBC
HEMOGLOBIN: 13.8 g/dL (ref 11.1–15.9)
Hematocrit: 41.3 % (ref 34.0–46.6)
MCH: 28.9 pg (ref 26.6–33.0)
MCHC: 33.4 g/dL (ref 31.5–35.7)
MCV: 87 fL (ref 79–97)
PLATELETS: 220 10*3/uL (ref 150–379)
RBC: 4.77 x10E6/uL (ref 3.77–5.28)
RDW: 14.3 % (ref 12.3–15.4)
WBC: 7.5 10*3/uL (ref 3.4–10.8)

## 2016-05-20 LAB — VITAMIN D 25 HYDROXY (VIT D DEFICIENCY, FRACTURES): VIT D 25 HYDROXY: 50.9 ng/mL (ref 30.0–100.0)

## 2016-05-20 LAB — IRON: IRON: 147 ug/dL (ref 27–159)

## 2016-05-20 NOTE — Telephone Encounter (Signed)
-----   Message from Purcell NailsMelody N Shambley, [place] sent at 05/20/2016 12:05 PM EDT ----- All labs are normal

## 2016-05-20 NOTE — Telephone Encounter (Signed)
Notified pt of results 

## 2016-05-21 ENCOUNTER — Encounter: Payer: 59 | Admitting: Obstetrics and Gynecology

## 2017-01-03 ENCOUNTER — Telehealth: Payer: Self-pay | Admitting: Obstetrics and Gynecology

## 2017-01-03 NOTE — Telephone Encounter (Signed)
Patient Morgan Pham wanting to speak with provider (Melody) to switch birth control because patient has stopped breast feeding. Patient would like to speak with provider or nurse before scheduling an appointment if necessary. Please advise.

## 2017-01-04 ENCOUNTER — Other Ambulatory Visit: Payer: Self-pay | Admitting: Obstetrics and Gynecology

## 2017-01-04 NOTE — Telephone Encounter (Signed)
I can switch her pills if that is what she is wanting to use, but she also is way past due for physical. Please have her schedule it and let me know on the pills and I will call in one month.

## 2017-01-04 NOTE — Telephone Encounter (Signed)
pls advise

## 2017-01-06 ENCOUNTER — Other Ambulatory Visit: Payer: Self-pay | Admitting: Obstetrics and Gynecology

## 2017-01-06 NOTE — Telephone Encounter (Signed)
Morgan Pham, Morgan Pham made appointment for annual, needs birth control she is done breast feeding

## 2017-01-07 ENCOUNTER — Encounter: Payer: 59 | Admitting: Obstetrics and Gynecology

## 2017-01-11 ENCOUNTER — Other Ambulatory Visit: Payer: Self-pay | Admitting: *Deleted

## 2017-01-11 MED ORDER — NORGESTIMATE-ETH ESTRADIOL 0.25-35 MG-MCG PO TABS: 1.0000 | ORAL_TABLET | Freq: Every day | ORAL | 0 refills | Status: DC

## 2017-01-17 ENCOUNTER — Telehealth: Payer: Self-pay | Admitting: Obstetrics and Gynecology

## 2017-01-17 NOTE — Telephone Encounter (Signed)
Please call patient concerning the medication refill being sent to the wrong pharmacy again

## 2017-01-17 NOTE — Telephone Encounter (Signed)
Left pt detailed message to call back with correct pharmacy

## 2017-03-17 ENCOUNTER — Encounter: Payer: Self-pay | Admitting: Obstetrics and Gynecology

## 2017-03-17 ENCOUNTER — Other Ambulatory Visit: Payer: Self-pay | Admitting: Obstetrics and Gynecology

## 2017-03-17 ENCOUNTER — Ambulatory Visit (INDEPENDENT_AMBULATORY_CARE_PROVIDER_SITE_OTHER): Payer: BLUE CROSS/BLUE SHIELD | Admitting: Obstetrics and Gynecology

## 2017-03-17 VITALS — BP 118/74 | HR 69 | Ht 64.0 in | Wt 163.2 lb

## 2017-03-17 DIAGNOSIS — Z01419 Encounter for gynecological examination (general) (routine) without abnormal findings: Secondary | ICD-10-CM | POA: Diagnosis not present

## 2017-03-17 MED ORDER — NORGESTIMATE-ETH ESTRADIOL 0.25-35 MG-MCG PO TABS: 1.0000 | ORAL_TABLET | Freq: Every day | ORAL | 4 refills | Status: DC

## 2017-03-17 NOTE — Progress Notes (Signed)
Subjective:   Morgan Pham is a 41 y.o. 82P0201 Caucasian female here for a routine well-woman exam.  Patient's last menstrual period was 03/09/2017.    Current complaints: none PCP: [facility]       does desire labs  Social History: Sexual: heterosexual Marital Status: married Living situation: with family Occupation: homemaker Tobacco/alcohol: no tobacco use Illicit drugs: no history of illicit drug use  The following portions of the patient's history were reviewed and updated as appropriate: allergies, current medications, past family history, past medical history, past social history, past surgical history and problem list.  Past Medical History Past Medical History:  Diagnosis Date  . Allergy   . Asthma    childhood    Past Surgical History Past Surgical History:  Procedure Laterality Date  . CESAREAN SECTION N/A 03/28/2016   Procedure: CESAREAN SECTION;  Surgeon: Hildred LaserAnika Cherry, MD;  Location: [facility] ORS;  Service: Obstetrics;  Laterality: N/A;  . DILATION AND CURETTAGE OF UTERUS     poc x2  . LEEP     [name] has been getting pap's now yearly    Gynecologic History G2P0201  Patient's last menstrual period was 03/09/2017. Contraception: OCP (estrogen/progesterone) Last Pap: 2014. Results were: normal   Obstetric History OB History  Gravida Para Term Preterm AB Living  2 2   2   1   SAB TAB Ectopic Multiple Live Births        0 1    # Outcome Date GA Lbr Len/2nd Weight Sex Delivery Anes PTL Lv  2 Preterm 03/28/16 [id number]  4 lb 3.7 oz (1.92 kg) M CS-LTranv EPI  LIV  1 Preterm 05/2006 320w0d  5 lb 9 oz (2.523 kg) F Vag-Spont  N LIV      Current Medications Current Outpatient Prescriptions on File Prior to Visit  Medication Sig Dispense Refill  . aluminum chloride (DRYSOL) 20 % external solution Apply topically at bedtime. 35 mL 3  . norgestimate-ethinyl estradiol (ORTHO-CYCLEN,SPRINTEC,PREVIFEM) 0.25-35 MG-MCG tablet Take 1 tablet by mouth daily. 3 Package 0  .  oxyCODONE (OXY IR/ROXICODONE) 5 MG immediate release tablet Take 1 tablet (5 mg total) by mouth every 4 (four) hours as needed for moderate pain. (Patient not taking: Reported on 05/19/2016) 30 tablet 0   No current facility-administered medications on file prior to visit.     Review of Systems Patient denies any headaches, blurred vision, shortness of breath, chest pain, abdominal pain, problems with bowel movements, urination, or intercourse.  Objective:  BP 118/74   Pulse 69   Ht 5\' 4"  (1.626 m)   Wt 163 lb 3.2 oz (74 kg)   LMP 03/09/2017   BMI 28.01 kg/m  Physical Exam  General:  Well developed, well nourished, no acute distress. She is alert and oriented x3. Skin:  Warm and dry Neck:  Midline trachea, no thyromegaly or nodules Cardiovascular: Regular rate and rhythm, no murmur heard Lungs:  Effort normal, all lung fields clear to auscultation bilaterally Breasts:  No dominant palpable mass, retraction, or nipple discharge Abdomen:  Soft, non tender, no hepatosplenomegaly or masses Pelvic:  External genitalia is normal in appearance.  The vagina is normal in appearance. The cervix is bulbous, no CMT.  Thin prep pap is done with HR HPV cotesting. Uterus is felt to be normal size, shape, and contour.  No adnexal masses or tenderness noted. Extremities:  No swelling or varicosities noted Psych:  She has a normal mood and affect  Assessment:   Healthy well-woman exam overweight  Plan:  Labs obtained and will follow up accordingly F/U 1 year for AE, or sooner if needed   Arleta Ostrum Suzan NailerN Russie Gulledge, CNM

## 2017-03-17 NOTE — Patient Instructions (Signed)
Preventive Care 18-39 Years, Female Preventive care refers to lifestyle choices and visits with your health care provider that can promote health and wellness. What does preventive care include?  A yearly physical exam. This is also called an annual well check.  Dental exams once or twice a year.  Routine eye exams. Ask your health care provider how often you should have your eyes checked.  Personal lifestyle choices, including: ? Daily care of your teeth and gums. ? Regular physical activity. ? Eating a healthy diet. ? Avoiding tobacco and drug use. ? Limiting alcohol use. ? Practicing safe sex. ? Taking vitamin and mineral supplements as recommended by your health care provider. What happens during an annual well check? The services and screenings done by your health care provider during your annual well check will depend on your age, overall health, lifestyle risk factors, and family history of disease. Counseling Your health care provider may ask you questions about your:  Alcohol use.  Tobacco use.  Drug use.  Emotional well-being.  Home and relationship well-being.  Sexual activity.  Eating habits.  Work and work Statistician.  Method of birth control.  Menstrual cycle.  Pregnancy history.  Screening You may have the following tests or measurements:  Height, weight, and BMI.  Diabetes screening. This is done by checking your blood sugar (glucose) after you have not eaten for a while (fasting).  Blood pressure.  Lipid and cholesterol levels. These may be checked every 5 years starting at age 38.  Skin check.  Hepatitis C blood test.  Hepatitis B blood test.  Sexually transmitted disease (STD) testing.  BRCA-related cancer screening. This may be done if you have a family history of breast, ovarian, tubal, or peritoneal cancers.  Pelvic exam and Pap test. This may be done every 3 years starting at age 38. Starting at age 30, this may be done  every 5 years if you have a Pap test in combination with an HPV test.  Discuss your test results, treatment options, and if necessary, the need for more tests with your health care provider. Vaccines Your health care provider may recommend certain vaccines, such as:  Influenza vaccine. This is recommended every year.  Tetanus, diphtheria, and acellular pertussis (Tdap, Td) vaccine. You may need a Td booster every 10 years.  Varicella vaccine. You may need this if you have not been vaccinated.  HPV vaccine. If you are 39 or younger, you may need three doses over 6 months.  Measles, mumps, and rubella (MMR) vaccine. You may need at least one dose of MMR. You may also need a second dose.  Pneumococcal 13-valent conjugate (PCV13) vaccine. You may need this if you have certain conditions and were not previously vaccinated.  Pneumococcal polysaccharide (PPSV23) vaccine. You may need one or two doses if you smoke cigarettes or if you have certain conditions.  Meningococcal vaccine. One dose is recommended if you are age 68-21 years and a first-year college student living in a residence hall, or if you have one of several medical conditions. You may also need additional booster doses.  Hepatitis A vaccine. You may need this if you have certain conditions or if you travel or work in places where you may be exposed to hepatitis A.  Hepatitis B vaccine. You may need this if you have certain conditions or if you travel or work in places where you may be exposed to hepatitis B.  Haemophilus influenzae type b (Hib) vaccine. You may need this  if you have certain risk factors.  Talk to your health care provider about which screenings and vaccines you need and how often you need them. This information is not intended to replace advice given to you by your health care provider. Make sure you discuss any questions you have with your health care provider. Document Released: 09/28/2001 Document Revised:  04/21/2016 Document Reviewed: 06/03/2015 Elsevier Interactive Patient Education  2017 Elsevier Inc.  

## 2017-03-21 LAB — CYTOLOGY - PAP

## 2017-03-24 ENCOUNTER — Other Ambulatory Visit: Payer: Self-pay | Admitting: Obstetrics and Gynecology

## 2017-03-24 ENCOUNTER — Telehealth: Payer: Self-pay | Admitting: *Deleted

## 2017-03-24 NOTE — Telephone Encounter (Signed)
Mailed lab results to pt

## 2017-03-24 NOTE — Telephone Encounter (Signed)
-----   Message from Purcell NailsMelody N Shambley, [place] sent at 03/24/2017 10:54 AM EDT ----- Please let her know pap & HPV are both negative, and remind about MyChart as they are moving

## 2017-03-25 LAB — LIPID PANEL
CHOL/HDL RATIO: 2.5 ratio (ref 0.0–4.4)
Cholesterol, Total: 163 mg/dL (ref 100–199)
HDL: 64 mg/dL (ref >39)
LDL Calculated: 75 mg/dL (ref 0–99)
Triglycerides: 120 mg/dL (ref 0–149)
VLDL CHOLESTEROL CAL: 24 mg/dL (ref 5–40)

## 2017-03-25 LAB — COMPREHENSIVE METABOLIC PANEL
A/G RATIO: 1.5 (ref 1.2–2.2)
ALBUMIN: 4.3 g/dL (ref 3.5–5.5)
ALT: 14 IU/L (ref 0–32)
AST: 14 IU/L (ref 0–40)
Alkaline Phosphatase: 66 IU/L (ref 39–117)
BILIRUBIN TOTAL: 0.2 mg/dL (ref 0.0–1.2)
BUN / CREAT RATIO: 13 (ref 9–23)
BUN: 11 mg/dL (ref 6–20)
CHLORIDE: 102 mmol/L (ref 96–106)
CO2: 23 mmol/L (ref 20–29)
Calcium: 9.5 mg/dL (ref 8.7–10.2)
Creatinine, Ser: 0.87 mg/dL (ref 0.57–1.00)
GFR, EST AFRICAN AMERICAN: 98 mL/min/{1.73_m2} (ref >59)
GFR, EST NON AFRICAN AMERICAN: 85 mL/min/{1.73_m2} (ref >59)
Globulin, Total: 2.8 g/dL (ref 1.5–4.5)
Glucose: 89 mg/dL (ref 65–99)
POTASSIUM: 5 mmol/L (ref 3.5–5.2)
Sodium: 140 mmol/L (ref 134–144)
TOTAL PROTEIN: 7.1 g/dL (ref 6.0–8.5)

## 2017-03-25 LAB — THYROID PANEL WITH TSH
FREE THYROXINE INDEX: 2.5 (ref 1.2–4.9)
T3 Uptake Ratio: 23 % — ABNORMAL LOW (ref 24–39)
T4 TOTAL: 10.8 ug/dL (ref 4.5–12.0)
TSH: 2.3 u[IU]/mL (ref 0.450–4.500)

## 2017-03-29 ENCOUNTER — Telehealth: Payer: Self-pay | Admitting: *Deleted

## 2017-03-29 NOTE — Telephone Encounter (Signed)
-----   Message from Purcell NailsMelody N Shambley, [place] sent at 03/28/2017  7:52 PM EDT ----- Please let her know labs look normal

## 2017-03-29 NOTE — Telephone Encounter (Signed)
Mailed info to pt 

## 2017-04-08 ENCOUNTER — Other Ambulatory Visit: Payer: Self-pay | Admitting: Obstetrics and Gynecology

## 2018-05-04 ENCOUNTER — Ambulatory Visit (INDEPENDENT_AMBULATORY_CARE_PROVIDER_SITE_OTHER): Payer: BLUE CROSS/BLUE SHIELD | Admitting: Obstetrics and Gynecology

## 2018-05-04 ENCOUNTER — Encounter: Payer: Self-pay | Admitting: Obstetrics and Gynecology

## 2018-05-04 ENCOUNTER — Other Ambulatory Visit (HOSPITAL_COMMUNITY)
Admission: RE | Admit: 2018-05-04 | Discharge: 2018-05-04 | Disposition: A | Discharge status: Home / Self Care | Payer: BLUE CROSS/BLUE SHIELD | Source: Ambulatory Visit | Attending: Obstetrics and Gynecology | Admitting: Obstetrics and Gynecology

## 2018-05-04 VITALS — BP 121/82 | HR 56 | Wt 155.0 lb

## 2018-05-04 DIAGNOSIS — Z01419 Encounter for gynecological examination (general) (routine) without abnormal findings: Secondary | ICD-10-CM | POA: Diagnosis present

## 2018-05-04 MED ORDER — NITROFURANTOIN MONOHYD MACRO 100 MG PO CAPS: 100.0000 mg | ORAL_CAPSULE | Freq: Two times a day (BID) | ORAL | 2 refills | Status: DC

## 2018-05-04 NOTE — Patient Instructions (Signed)
Preventive Care 18-39 Years, Female Preventive care refers to lifestyle choices and visits with your health care provider that can promote health and wellness. What does preventive care include?  A yearly physical exam. This is also called an annual well check.  Dental exams once or twice a year.  Routine eye exams. Ask your health care provider how often you should have your eyes checked.  Personal lifestyle choices, including: ? Daily care of your teeth and gums. ? Regular physical activity. ? Eating a healthy diet. ? Avoiding tobacco and drug use. ? Limiting alcohol use. ? Practicing safe sex. ? Taking vitamin and mineral supplements as recommended by your health care provider. What happens during an annual well check? The services and screenings done by your health care provider during your annual well check will depend on your age, overall health, lifestyle risk factors, and family history of disease. Counseling Your health care provider may ask you questions about your:  Alcohol use.  Tobacco use.  Drug use.  Emotional well-being.  Home and relationship well-being.  Sexual activity.  Eating habits.  Work and work Statistician.  Method of birth control.  Menstrual cycle.  Pregnancy history.  Screening You may have the following tests or measurements:  Height, weight, and BMI.  Diabetes screening. This is done by checking your blood sugar (glucose) after you have not eaten for a while (fasting).  Blood pressure.  Lipid and cholesterol levels. These may be checked every 5 years starting at age 38.  Skin check.  Hepatitis C blood test.  Hepatitis B blood test.  Sexually transmitted disease (STD) testing.  BRCA-related cancer screening. This may be done if you have a family history of breast, ovarian, tubal, or peritoneal cancers.  Pelvic exam and Pap test. This may be done every 3 years starting at age 38. Starting at age 30, this may be done  every 5 years if you have a Pap test in combination with an HPV test.  Discuss your test results, treatment options, and if necessary, the need for more tests with your health care provider. Vaccines Your health care provider may recommend certain vaccines, such as:  Influenza vaccine. This is recommended every year.  Tetanus, diphtheria, and acellular pertussis (Tdap, Td) vaccine. You may need a Td booster every 10 years.  Varicella vaccine. You may need this if you have not been vaccinated.  HPV vaccine. If you are 39 or younger, you may need three doses over 6 months.  Measles, mumps, and rubella (MMR) vaccine. You may need at least one dose of MMR. You may also need a second dose.  Pneumococcal 13-valent conjugate (PCV13) vaccine. You may need this if you have certain conditions and were not previously vaccinated.  Pneumococcal polysaccharide (PPSV23) vaccine. You may need one or two doses if you smoke cigarettes or if you have certain conditions.  Meningococcal vaccine. One dose is recommended if you are age 68-21 years and a first-year college student living in a residence hall, or if you have one of several medical conditions. You may also need additional booster doses.  Hepatitis A vaccine. You may need this if you have certain conditions or if you travel or work in places where you may be exposed to hepatitis A.  Hepatitis B vaccine. You may need this if you have certain conditions or if you travel or work in places where you may be exposed to hepatitis B.  Haemophilus influenzae type b (Hib) vaccine. You may need this  if you have certain risk factors.  Talk to your health care provider about which screenings and vaccines you need and how often you need them. This information is not intended to replace advice given to you by your health care provider. Make sure you discuss any questions you have with your health care provider. Document Released: 09/28/2001 Document Revised:  04/21/2016 Document Reviewed: 06/03/2015 Elsevier Interactive Patient Education  2018 Elsevier Inc.  

## 2018-05-04 NOTE — Progress Notes (Signed)
Subjective:   Morgan Pham is a 42 y.o. G43P0201 Hispanic female here for a routine well-woman exam.  Patient's last menstrual period was 04/26/2018.   Still living in [place]. Current complaints: had 3-4 UTIs in last year.  PCP: Environmental health practitioner       does desire labs  Social History: Sexual: heterosexual Marital Status: married Living situation: with family Occupation: homemaker Tobacco/alcohol: no tobacco use Illicit drugs: no history of illicit drug use  The following portions of the patient's history were reviewed and updated as appropriate: allergies, current medications, past family history, past medical history, past social history, past surgical history and problem list.  Past Medical History Past Medical History:  Diagnosis Date  . Allergy   . Asthma    childhood    Past Surgical History Past Surgical History:  Procedure Laterality Date  . CESAREAN SECTION N/A 03/28/2016   Procedure: CESAREAN SECTION;  Surgeon: Hildred Laser, MD;  Location: [facility] ORS;  Service: Obstetrics;  Laterality: N/A;  . DILATION AND CURETTAGE OF UTERUS     poc x2  . LEEP     Morgan Pham has been getting pap's now yearly    Gynecologic History G2P0201  Patient's last menstrual period was 04/26/2018. Contraception: OCP (estrogen/progesterone) Last Pap: 2018. Results were: normal L  Obstetric History OB History  Gravida Para Term Preterm AB Living  2 2   2   1   SAB TAB Ectopic Multiple Live Births        0 1    # Outcome Date GA Lbr Len/2nd Weight Sex Delivery Anes PTL Lv  2 Preterm 03/28/16 [redacted]w[redacted]d  4 lb 3.7 oz (1.92 kg) M CS-LTranv EPI  LIV  1 Preterm 05/2006 [redacted]w[redacted]d  5 lb 9 oz (2.523 kg) F Vag-Spont  N LIV    Current Medications Current Outpatient Medications on File Prior to Visit  Medication Sig Dispense Refill  . SPRINTEC 28 0.25-35 MG-MCG tablet TAKE 1 TABLET BY MOUTH EVERY DAY 84 tablet 0  . aluminum chloride (DRYSOL) 20 % external solution Apply topically at bedtime. (Patient not taking:  Reported on 05/04/2018) 35 mL 3  . norgestimate-ethinyl estradiol (ORTHO-CYCLEN,SPRINTEC,PREVIFEM) 0.25-35 MG-MCG tablet Take 1 tablet by mouth daily. (Patient not taking: Reported on 05/04/2018) 3 Package 4  . oxyCODONE (OXY IR/ROXICODONE) 5 MG immediate release tablet Take 1 tablet (5 mg total) by mouth every 4 (four) hours as needed for moderate pain. (Patient not taking: Reported on 05/19/2016) 30 tablet 0   No current facility-administered medications on file prior to visit.     Review of Systems Patient denies any headaches, blurred vision, shortness of breath, chest pain, abdominal pain, problems with bowel movements, urination, or intercourse.  Objective:  BP 121/82   Pulse (!) 56   Wt 155 lb (70.3 kg)   LMP 04/26/2018   BMI 26.61 kg/m  Physical Exam  General:  Well developed, well nourished, no acute distress. She is alert and oriented x3. Skin:  Warm and dry Neck:  Midline trachea, no thyromegaly or nodules Cardiovascular: Regular rate and rhythm, no murmur heard Lungs:  Effort normal, all lung fields clear to auscultation bilaterally Breasts:  No dominant palpable mass, retraction, or nipple discharge Abdomen:  Soft, non tender, no hepatosplenomegaly or masses Pelvic:  External genitalia is normal in appearance.  The vagina is normal in appearance. The cervix is bulbous, no CMT.  Thin prep pap is not done . Uterus is felt to be normal size, shape, and contour.  No adnexal masses or  tenderness noted. Extremities:  No swelling or varicosities noted Psych:  She has a normal mood and affect UA negative Assessment:   Healthy well-woman exam Needs flu vaccine Frequent UTIs  Plan:  Labs obtained- will follow up accordingly Declined flu vaccine Will give standing macrobid for prn use. F/U 1 year for AE, or sooner if needed   Morgan Pham, CNM

## 2018-05-05 LAB — COMPREHENSIVE METABOLIC PANEL
A/G RATIO: 1.6 (ref 1.2–2.2)
ALBUMIN: 4.6 g/dL (ref 3.5–5.5)
ALK PHOS: 62 IU/L (ref 39–117)
ALT: 18 IU/L (ref 0–32)
AST: 14 IU/L (ref 0–40)
BUN / CREAT RATIO: 15 (ref 9–23)
BUN: 15 mg/dL (ref 6–20)
Bilirubin Total: 0.4 mg/dL (ref 0.0–1.2)
CHLORIDE: 100 mmol/L (ref 96–106)
CO2: 21 mmol/L (ref 20–29)
Calcium: 9.5 mg/dL (ref 8.7–10.2)
Creatinine, Ser: 1.01 mg/dL — ABNORMAL HIGH (ref 0.57–1.00)
GFR calc Af Amer: 81 mL/min/{1.73_m2} (ref >59)
GFR calc non Af Amer: 70 mL/min/{1.73_m2} (ref >59)
GLUCOSE: 99 mg/dL (ref 65–99)
Globulin, Total: 2.9 g/dL (ref 1.5–4.5)
POTASSIUM: 4 mmol/L (ref 3.5–5.2)
SODIUM: 139 mmol/L (ref 134–144)
Total Protein: 7.5 g/dL (ref 6.0–8.5)

## 2018-05-05 LAB — THYROID PANEL WITH TSH
FREE THYROXINE INDEX: 2.4 (ref 1.2–4.9)
T3 Uptake Ratio: 24 % (ref 24–39)
T4, Total: 9.8 ug/dL (ref 4.5–12.0)
TSH: 3.52 u[IU]/mL (ref 0.450–4.500)

## 2018-05-05 LAB — LIPID PANEL
CHOL/HDL RATIO: 2.6 ratio (ref 0.0–4.4)
Cholesterol, Total: 188 mg/dL (ref 100–199)
HDL: 71 mg/dL (ref >39)
LDL Calculated: 91 mg/dL (ref 0–99)
TRIGLYCERIDES: 130 mg/dL (ref 0–149)
VLDL Cholesterol Cal: 26 mg/dL (ref 5–40)

## 2018-05-08 LAB — CYTOLOGY - PAP
Diagnosis: NEGATIVE
HPV (WINDOPATH): NOT DETECTED

## 2018-05-09 ENCOUNTER — Telehealth: Payer: Self-pay | Admitting: *Deleted

## 2018-05-09 NOTE — Telephone Encounter (Signed)
Mailed info to pt 

## 2018-05-09 NOTE — Telephone Encounter (Signed)
-----   Message from Purcell NailsMelody N Shambley, [place] sent at 05/09/2018 11:17 AM EDT ----- All labs are normal and pap is negative

## 2018-05-10 ENCOUNTER — Telehealth: Payer: Self-pay | Admitting: Obstetrics and Gynecology

## 2018-05-10 ENCOUNTER — Other Ambulatory Visit: Payer: Self-pay | Admitting: Obstetrics and Gynecology

## 2018-05-10 NOTE — Telephone Encounter (Signed)
The pt called and stated that she is having a medication issue and needs her birth control sent to her pharmacy. The patient would like a call back if possible. Please advise.

## 2018-05-10 NOTE — Telephone Encounter (Signed)
Done-ac 

## 2019-01-25 ENCOUNTER — Telehealth: Payer: Self-pay | Admitting: Obstetrics and Gynecology

## 2019-01-25 NOTE — Telephone Encounter (Signed)
Pt would like to change her birth control, shes very irritable, no sex drive, would like to change

## 2019-01-25 NOTE — Telephone Encounter (Signed)
The patient called and stated that she would like to speak with Amy or Melody in regards to her wanting to change her birth control. Please advise.

## 2019-01-26 ENCOUNTER — Other Ambulatory Visit: Payer: Self-pay | Admitting: *Deleted

## 2019-01-26 ENCOUNTER — Other Ambulatory Visit: Payer: Self-pay | Admitting: Obstetrics and Gynecology

## 2019-01-26 MED ORDER — NORETHIN ACE-ETH ESTRAD-FE 1-20 MG-MCG PO TABS
1.0000 | ORAL_TABLET | Freq: Every day | ORAL | 11 refills | Status: DC
Start: 2019-01-26 — End: 2019-01-26

## 2019-01-26 MED ORDER — NORETHIN ACE-ETH ESTRAD-FE 1-20 MG-MCG PO TABS: 1.0000 | ORAL_TABLET | Freq: Every day | ORAL | 4 refills | Status: DC

## 2019-01-26 NOTE — Telephone Encounter (Signed)
What is she wanting to change to?

## 2019-01-26 NOTE — Telephone Encounter (Signed)
I sent in a prescription for junel- please have her switch over to it when she picks it up.

## 2019-01-26 NOTE — Telephone Encounter (Signed)
States a different birth control??

## 2019-05-10 ENCOUNTER — Encounter: Payer: BLUE CROSS/BLUE SHIELD | Admitting: Obstetrics and Gynecology

## 2019-06-12 ENCOUNTER — Encounter: Payer: Self-pay | Admitting: Obstetrics and Gynecology

## 2019-06-12 ENCOUNTER — Other Ambulatory Visit: Payer: Self-pay

## 2019-06-12 ENCOUNTER — Ambulatory Visit: Payer: BC Managed Care – PPO | Admitting: Obstetrics and Gynecology

## 2019-06-12 VITALS — BP 105/75 | HR 66 | Ht 64.0 in | Wt 168.6 lb

## 2019-06-12 DIAGNOSIS — R42 Dizziness and giddiness: Secondary | ICD-10-CM

## 2019-06-12 DIAGNOSIS — Z01419 Encounter for gynecological examination (general) (routine) without abnormal findings: Secondary | ICD-10-CM

## 2019-06-12 LAB — POCT URINE PREGNANCY: Preg Test, Ur: NEGATIVE

## 2019-06-12 MED ORDER — NORETHIN ACE-ETH ESTRAD-FE 1-20 MG-MCG PO TABS: 1.0000 | ORAL_TABLET | Freq: Every day | ORAL | 4 refills | Status: DC

## 2019-06-12 NOTE — Progress Notes (Unsigned)
Subjective:   Morgan Pham is a 43 y.o. G23P0201 Hispanic female here for a routine well-woman exam.  Patient's last menstrual period was 05/21/2019 (approximate).    Current complaints: sudden onset of dizzy spell with the room spinning when she lays down. It last on and off for a total of 4 days. No other symptoms, and states the only time she has had dizziness in the past was during her pregnancies.  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  Past Medical History Past Medical History:  Diagnosis Date  . Allergy   . Asthma    childhood    Past Surgical History Past Surgical History:  Procedure Laterality Date  . CESAREAN SECTION N/A 03/28/2016   Procedure: CESAREAN SECTION;  Surgeon: Rubie Maid, MD;  Location: [facility] ORS;  Service: Obstetrics;  Laterality: N/A;  . DILATION AND CURETTAGE OF UTERUS     poc x2  . LEEP     Loistine Simas has been getting pap's now yearly    Gynecologic History G2P0201  Patient's last menstrual period was 05/21/2019 (approximate). Contraception: OCP (estrogen/progesterone) Last Pap: 2019. Results were: normal   Obstetric History OB History  Gravida Para Term Preterm AB Living  2 2   2   1   SAB TAB Ectopic Multiple Live Births        0 1    # Outcome Date GA Lbr Len/2nd Weight Sex Delivery Anes PTL Lv  2 Preterm 03/28/16 [redacted]w[redacted]d  4 lb 3.7 oz (1.92 kg) M CS-LTranv EPI  LIV  1 Preterm 05/2006 [redacted]w[redacted]d  5 lb 9 oz (2.523 kg) F Vag-Spont  N LIV    Current Medications Current Outpatient Medications on File Prior to Visit  Medication Sig Dispense  Refill  . norethindrone-ethinyl estradiol (JUNEL FE 1/20) 1-20 MG-MCG tablet Take 1 tablet by mouth daily. 1 Package 4  . aluminum chloride (DRYSOL) 20 % external solution Apply topically at bedtime. (Patient not taking: Reported on 05/04/2018) 35 mL 3  . nitrofurantoin, macrocrystal-monohydrate, (MACROBID) 100 MG capsule Take 1 capsule (100 mg total) by mouth 2 (two) times daily. (Patient not taking: Reported on 06/12/2019) 60 capsule 2  . oxyCODONE (OXY IR/ROXICODONE) 5 MG immediate release tablet Take 1 tablet (5 mg total) by mouth every 4 (four) hours as needed for moderate pain. (Patient not taking: Reported on 05/19/2016) 30 tablet 0   No current facility-administered medications on file prior to visit.     Review of Systems Patient denies any headaches, blurred vision, shortness of breath, chest pain, abdominal pain, problems with bowel movements, urination, or intercourse.  Objective:  BP 105/75   Pulse 66   Ht 5\' 4"  (1.626 m)   Wt 168 lb 9 oz (76.5 kg)   LMP 05/21/2019 (Approximate)   BMI 28.93 kg/m  Physical Exam  General:  Well developed, well nourished, no acute distress. She is alert and oriented x3. Skin:  Warm and dry Neck:  Midline trachea, no thyromegaly or nodules Cardiovascular: Regular rate and rhythm, no murmur heard Lungs:  Effort normal, all lung fields clear to auscultation bilaterally Breasts:  No dominant palpable mass, retraction, or nipple discharge  Abdomen:  Soft, non tender, no hepatosplenomegaly or masses Pelvic:  External genitalia is normal in appearance.  The vagina is normal in appearance. The cervix is bulbous, no CMT.  Thin prep pap is not done . Uterus is felt to be normal size, shape, and contour.  No adnexal masses or tenderness noted. UPT- Extremities:  No swelling or varicosities noted Psych:  She has a normal mood and affect  Assessment:   Healthy well-woman exam Dizziness BMI 28  Plan:  Labs obtained-will follow up  accordingly. Discussed different causes of dizziness/vertigo and treatment options. She will let me know if it keeps happening.  Declined flu vaccine. Discussed cycling trough on current OCPs due to pain with first day of menses. Will consider.  F/U 1 year for AE, or sooner if needed Mammogram ordered Raeven Pint Suzan Nailer, CNM

## 2019-06-12 NOTE — Patient Instructions (Signed)
 Preventive Care 21-41 Years Old, Female Preventive care refers to visits with your health care provider and lifestyle choices that can promote health and wellness. This includes:  A yearly physical exam. This may also be called an annual well check.  Regular dental visits and eye exams.  Immunizations.  Screening for certain conditions.  Healthy lifestyle choices, such as eating a healthy diet, getting regular exercise, not using drugs or products that contain nicotine and tobacco, and limiting alcohol use. What can I expect for my preventive care visit? Physical exam Your health care provider will check your:  Height and weight. This may be used to calculate body mass index (BMI), which tells if you are at a healthy weight.  Heart rate and blood pressure.  Skin for abnormal spots. Counseling Your health care provider may ask you questions about your:  Alcohol, tobacco, and drug use.  Emotional well-being.  Home and relationship well-being.  Sexual activity.  Eating habits.  Work and work environment.  Method of birth control.  Menstrual cycle.  Pregnancy history. What immunizations do I need?  Influenza (flu) vaccine  This is recommended every year. Tetanus, diphtheria, and pertussis (Tdap) vaccine  You may need a Td booster every 10 years. Varicella (chickenpox) vaccine  You may need this if you have not been vaccinated. Human papillomavirus (HPV) vaccine  If recommended by your health care provider, you may need three doses over 6 months. Measles, mumps, and rubella (MMR) vaccine  You may need at least one dose of MMR. You may also need a second dose. Meningococcal conjugate (MenACWY) vaccine  One dose is recommended if you are age 19-21 years and a first-year college student living in a residence hall, or if you have one of several medical conditions. You may also need additional booster doses. Pneumococcal conjugate (PCV13) vaccine  You may need  this if you have certain conditions and were not previously vaccinated. Pneumococcal polysaccharide (PPSV23) vaccine  You may need one or two doses if you smoke cigarettes or if you have certain conditions. Hepatitis A vaccine  You may need this if you have certain conditions or if you travel or work in places where you may be exposed to hepatitis A. Hepatitis B vaccine  You may need this if you have certain conditions or if you travel or work in places where you may be exposed to hepatitis B. Haemophilus influenzae type b (Hib) vaccine  You may need this if you have certain conditions. You may receive vaccines as individual doses or as more than one vaccine together in one shot (combination vaccines). Talk with your health care provider about the risks and benefits of combination vaccines. What tests do I need?  Blood tests  Lipid and cholesterol levels. These may be checked every 5 years starting at age 20.  Hepatitis C test.  Hepatitis B test. Screening  Diabetes screening. This is done by checking your blood sugar (glucose) after you have not eaten for a while (fasting).  Sexually transmitted disease (STD) testing.  BRCA-related cancer screening. This may be done if you have a family history of breast, ovarian, tubal, or peritoneal cancers.  Pelvic exam and Pap test. This may be done every 3 years starting at age 21. Starting at age 30, this may be done every 5 years if you have a Pap test in combination with an HPV test. Talk with your health care provider about your test results, treatment options, and if necessary, the need for more   tests. Follow these instructions at home: Eating and drinking   Eat a diet that includes fresh fruits and vegetables, whole grains, lean protein, and low-fat dairy.  Take vitamin and mineral supplements as recommended by your health care provider.  Do not drink alcohol if: ? Your health care provider tells you not to drink. ? You are  pregnant, may be pregnant, or are planning to become pregnant.  If you drink alcohol: ? Limit how much you have to 0-1 drink a day. ? Be aware of how much alcohol is in your drink. In the [place] one drink equals one 12 oz bottle of beer (355 mL), one 5 oz glass of wine (148 mL), or one 1 oz glass of hard liquor (44 mL). Lifestyle  Take daily care of your teeth and gums.  Stay active. Exercise for at least 30 minutes on 5 or more days each week.  Do not use any products that contain nicotine or tobacco, such as cigarettes, e-cigarettes, and chewing tobacco. If you need help quitting, ask your health care provider.  If you are sexually active, practice safe sex. Use a condom or other form of birth control (contraception) in order to prevent pregnancy and STIs (sexually transmitted infections). If you plan to become pregnant, see your health care provider for a preconception visit. What's next?  Visit your health care provider once a year for a well check visit.  Ask your health care provider how often you should have your eyes and teeth checked.  Stay up to date on all vaccines. This information is not intended to replace advice given to you by your health care provider. Make sure you discuss any questions you have with your health care provider. Document Released: 09/28/2001 Document Revised: 04/13/2018 Document Reviewed: 04/13/2018 Elsevier Patient Education  2020 [employer].

## 2019-06-12 NOTE — Progress Notes (Unsigned)
Patient is here for an annual exam. Requested a UPT. C/o vertigo. LPS 05/04/18 WNL. Declined flu shot

## 2019-06-13 LAB — COMPREHENSIVE METABOLIC PANEL
ALT: 18 IU/L (ref 0–32)
AST: 17 IU/L (ref 0–40)
Albumin/Globulin Ratio: 1.6 (ref 1.2–2.2)
Albumin: 4.7 g/dL (ref 3.8–4.8)
Alkaline Phosphatase: 62 IU/L (ref 39–117)
BUN/Creatinine Ratio: 14 (ref 9–23)
BUN: 12 mg/dL (ref 6–24)
Bilirubin Total: 0.3 mg/dL (ref 0.0–1.2)
CO2: 22 mmol/L (ref 20–29)
Calcium: 10 mg/dL (ref 8.7–10.2)
Chloride: 105 mmol/L (ref 96–106)
Creatinine, Ser: 0.83 mg/dL (ref 0.57–1.00)
GFR calc Af Amer: 102 mL/min/{1.73_m2} (ref >59)
GFR calc non Af Amer: 88 mL/min/{1.73_m2} (ref >59)
Globulin, Total: 3 g/dL (ref 1.5–4.5)
Glucose: 101 mg/dL — ABNORMAL HIGH (ref 65–99)
Potassium: 4.9 mmol/L (ref 3.5–5.2)
Sodium: 144 mmol/L (ref 134–144)
Total Protein: 7.7 g/dL (ref 6.0–8.5)

## 2019-06-13 LAB — CBC
Hematocrit: 41.7 % (ref 34.0–46.6)
Hemoglobin: 14.1 g/dL (ref 11.1–15.9)
MCH: 29.4 pg (ref 26.6–33.0)
MCHC: 33.8 g/dL (ref 31.5–35.7)
MCV: 87 fL (ref 79–97)
Platelets: 252 10*3/uL (ref 150–450)
RBC: 4.8 x10E6/uL (ref 3.77–5.28)
RDW: 12.4 % (ref 11.7–15.4)
WBC: 7 10*3/uL (ref 3.4–10.8)

## 2019-06-13 LAB — LIPID PANEL
Chol/HDL Ratio: 2.9 ratio (ref 0.0–4.4)
Cholesterol, Total: 186 mg/dL (ref 100–199)
HDL: 65 mg/dL (ref >39)
LDL Chol Calc (NIH): 102 mg/dL — ABNORMAL HIGH (ref 0–99)
Triglycerides: 108 mg/dL (ref 0–149)
VLDL Cholesterol Cal: 19 mg/dL (ref 5–40)

## 2019-06-13 LAB — HEMOGLOBIN A1C
Est. average glucose Bld gHb Est-mCnc: 111 mg/dL
Hgb A1c MFr Bld: 5.5 % (ref 4.8–5.6)

## 2019-06-13 LAB — B12 AND FOLATE PANEL
Folate: 17.3 ng/mL (ref >3.0)
Vitamin B-12: 456 pg/mL (ref 232–1245)

## 2019-06-13 LAB — VITAMIN D 25 HYDROXY (VIT D DEFICIENCY, FRACTURES): Vit D, 25-Hydroxy: 35 ng/mL (ref 30.0–100.0)

## 2019-06-13 LAB — FERRITIN: Ferritin: 118 ng/mL (ref 15–150)

## 2019-06-13 LAB — BETA HCG QUANT (REF LAB): hCG Quant: <1 m[IU]/mL

## 2020-06-20 NOTE — Progress Notes (Signed)
Pt present for annual exam. Pt's pap up to date last pap 05/04/2018 results negative. Mammogram due order placed.  Pt stated having irregular cycles, light flow with cramping, headaches and nausea. Pt think the irregular cycles are from the OCP. Pt requested a pregnancy test due to irregular cycles. Pt stated one month she did not have a cycle. UPT- NEG. Pt would like to discuss other birth control options due to issues she is having with current birth control.

## 2020-06-20 NOTE — Patient Instructions (Signed)
Preventive Care 40-42 Years Old, Female Preventive care refers to visits with your health care provider and lifestyle choices that can promote health and wellness. This includes:  A yearly physical exam. This may also be called an annual well check.  Regular dental visits and eye exams.  Immunizations.  Screening for certain conditions.  Healthy lifestyle choices, such as eating a healthy diet, getting regular exercise, not using drugs or products that contain nicotine and tobacco, and limiting alcohol use. What can I expect for my preventive care visit? Physical exam Your health care provider will check your:  Height and weight. This may be used to calculate body mass index (BMI), which tells if you are at a healthy weight.  Heart rate and blood pressure.  Skin for abnormal spots. Counseling Your health care provider may ask you questions about your:  Alcohol, tobacco, and drug use.  Emotional well-being.  Home and relationship well-being.  Sexual activity.  Eating habits.  Work and work environment.  Method of birth control.  Menstrual cycle.  Pregnancy history. What immunizations do I need?  Influenza (flu) vaccine  This is recommended every year. Tetanus, diphtheria, and pertussis (Tdap) vaccine  You may need a Td booster every 10 years. Varicella (chickenpox) vaccine  You may need this if you have not been vaccinated. Zoster (shingles) vaccine  You may need this after age 60. Measles, mumps, and rubella (MMR) vaccine  You may need at least one dose of MMR if you were born in 1957 or later. You may also need a second dose. Pneumococcal conjugate (PCV13) vaccine  You may need this if you have certain conditions and were not previously vaccinated. Pneumococcal polysaccharide (PPSV23) vaccine  You may need one or two doses if you smoke cigarettes or if you have certain conditions. Meningococcal conjugate (MenACWY) vaccine  You may need this if you  have certain conditions. Hepatitis A vaccine  You may need this if you have certain conditions or if you travel or work in places where you may be exposed to hepatitis A. Hepatitis B vaccine  You may need this if you have certain conditions or if you travel or work in places where you may be exposed to hepatitis B. Haemophilus influenzae type b (Hib) vaccine  You may need this if you have certain conditions. Human papillomavirus (HPV) vaccine  If recommended by your health care provider, you may need three doses over 6 months. You may receive vaccines as individual doses or as more than one vaccine together in one shot (combination vaccines). Talk with your health care provider about the risks and benefits of combination vaccines. What tests do I need? Blood tests  Lipid and cholesterol levels. These may be checked every 5 years, or more frequently if you are over 50 years old.  Hepatitis C test.  Hepatitis B test. Screening  Lung cancer screening. You may have this screening every year starting at age 55 if you have a 30-pack-year history of smoking and currently smoke or have quit within the past 15 years.  Colorectal cancer screening. All adults should have this screening starting at age 50 and continuing until age 75. Your health care provider may recommend screening at age 45 if you are at increased risk. You will have tests every 1-10 years, depending on your results and the type of screening test.  Diabetes screening. This is done by checking your blood sugar (glucose) after you have not eaten for a while (fasting). You may have this   done every 1-3 years.  Mammogram. This may be done every 1-2 years. Talk with your health care provider about when you should start having regular mammograms. This may depend on whether you have a family history of breast cancer.  BRCA-related cancer screening. This may be done if you have a family history of breast, ovarian, tubal, or peritoneal  cancers.  Pelvic exam and Pap test. This may be done every 3 years starting at age 60. Starting at age 7, this may be done every 5 years if you have a Pap test in combination with an HPV test. Other tests  Sexually transmitted disease (STD) testing.  Bone density scan. This is done to screen for osteoporosis. You may have this scan if you are at high risk for osteoporosis. Follow these instructions at home: Eating and drinking  Eat a diet that includes fresh fruits and vegetables, whole grains, lean protein, and low-fat dairy.  Take vitamin and mineral supplements as recommended by your health care provider.  Do not drink alcohol if: ? Your health care provider tells you not to drink. ? You are pregnant, may be pregnant, or are planning to become pregnant.  If you drink alcohol: ? Limit how much you have to 0-1 drink a day. ? Be aware of how much alcohol is in your drink. In the [place] one drink equals one 12 oz bottle of beer (355 mL), one 5 oz glass of wine (148 mL), or one 1 oz glass of hard liquor (44 mL). Lifestyle  Take daily care of your teeth and gums.  Stay active. Exercise for at least 30 minutes on 5 or more days each week.  Do not use any products that contain nicotine or tobacco, such as cigarettes, e-cigarettes, and chewing tobacco. If you need help quitting, ask your health care provider.  If you are sexually active, practice safe sex. Use a condom or other form of birth control (contraception) in order to prevent pregnancy and STIs (sexually transmitted infections).  If told by your health care provider, take low-dose aspirin daily starting at age 48. What's next?  Visit your health care provider once a year for a well check visit.  Ask your health care provider how often you should have your eyes and teeth checked.  Stay up to date on all vaccines. This information is not intended to replace advice given to you by your health care provider. Make sure you  discuss any questions you have with your health care provider. Document Revised: 04/13/2018 Document Reviewed: 04/13/2018 Elsevier Patient Education  2020 [place] Breast self-awareness is knowing how your breasts look and feel. Doing breast self-awareness is important. It allows you to catch a breast problem early while it is still small and can be treated. All women should do breast self-awareness, including women who have had breast implants. Tell your doctor if you notice a change in your breasts. What you need:  A mirror.  A well-lit room. How to do a breast self-exam A breast self-exam is one way to learn what is normal for your breasts and to check for changes. To do a breast self-exam: Look for changes  1. Take off all the clothes above your waist. 2. Stand in front of a mirror in a room with good lighting. 3. Put your hands on your hips. 4. Push your hands down. 5. Look at your breasts and nipples in the mirror to see if one breast or nipple looks different from the  other. Check to see if: ? The shape of one breast is different. ? The size of one breast is different. ? There are wrinkles, dips, and bumps in one breast and not the other. 6. Look at each breast for changes in the skin, such as: ? Redness. ? Scaly areas. 7. Look for changes in your nipples, such as: ? Liquid around the nipples. ? Bleeding. ? Dimpling. ? Redness. ? A change in where the nipples are. Feel for changes  1. Lie on your back on the floor. 2. Feel each breast. To do this, follow these steps: ? Pick a breast to feel. ? Put the arm closest to that breast above your head. ? Use your other arm to feel the nipple area of your breast. Feel the area with the pads of your three middle fingers by making small circles with your fingers. For the first circle, press lightly. For the second circle, press harder. For the third circle, press even harder. ? Keep making circles with  your fingers at the different pressures as you move down your breast. Stop when you feel your ribs. ? Move your fingers a little toward the center of your body. ? Start making circles with your fingers again, this time going up until you reach your collarbone. ? Keep making up-and-down circles until you reach your armpit. Remember to keep using the three pressures. ? Feel the other breast in the same way. 3. Sit or stand in the tub or shower. 4. With soapy water on your skin, feel each breast the same way you did in step 2 when you were lying on the floor. Write down what you find Writing down what you find can help you remember what to tell your doctor. Write down:  What is normal for each breast.  Any changes you find in each breast, including: ? The kind of changes you find. ? Whether you have pain. ? Size and location of any lumps.  When you last had your menstrual period. General tips  Check your breasts every month.  If you are breastfeeding, the best time to check your breasts is after you feed your baby or after you use a breast pump.  If you get menstrual periods, the best time to check your breasts is 5-7 days after your menstrual period is over.  With time, you will become comfortable with the self-exam, and you will begin to know if there are changes in your breasts. Contact a doctor if you:  See a change in the shape or size of your breasts or nipples.  See a change in the skin of your breast or nipples, such as red or scaly skin.  Have fluid coming from your nipples that is not normal.  Find a lump or thick area that was not there before.  Have pain in your breasts.  Have any concerns about your breast health. Summary  Breast self-awareness includes looking for changes in your breasts, as well as feeling for changes within your breasts.  Breast self-awareness should be done in front of a mirror in a well-lit room.  You should check your breasts every month.  If you get menstrual periods, the best time to check your breasts is 5-7 days after your menstrual period is over.  Let your doctor know of any changes you see in your breasts, including changes in size, changes on the skin, pain or tenderness, or fluid from your nipples that is not normal. This information is not  intended to replace advice given to you by your health care provider. Make sure you discuss any questions you have with your health care provider. Document Revised: 03/21/2018 Document Reviewed: 03/21/2018 Elsevier Patient Education  [place].

## 2020-06-23 ENCOUNTER — Encounter: Payer: Self-pay | Admitting: Certified Nurse Midwife

## 2020-06-23 ENCOUNTER — Other Ambulatory Visit: Payer: Self-pay

## 2020-06-23 ENCOUNTER — Ambulatory Visit (INDEPENDENT_AMBULATORY_CARE_PROVIDER_SITE_OTHER): Payer: BLUE CROSS/BLUE SHIELD | Admitting: Certified Nurse Midwife

## 2020-06-23 VITALS — BP 115/82 | HR 69 | Ht 64.0 in | Wt 176.6 lb

## 2020-06-23 DIAGNOSIS — N926 Irregular menstruation, unspecified: Secondary | ICD-10-CM | POA: Diagnosis not present

## 2020-06-23 DIAGNOSIS — Z1231 Encounter for screening mammogram for malignant neoplasm of breast: Secondary | ICD-10-CM | POA: Diagnosis not present

## 2020-06-23 DIAGNOSIS — E7800 Pure hypercholesterolemia, unspecified: Secondary | ICD-10-CM

## 2020-06-23 DIAGNOSIS — Z9889 Other specified postprocedural states: Secondary | ICD-10-CM | POA: Insufficient documentation

## 2020-06-23 DIAGNOSIS — Z6830 Body mass index (BMI) 30.0-30.9, adult: Secondary | ICD-10-CM

## 2020-06-23 DIAGNOSIS — Z3041 Encounter for surveillance of contraceptive pills: Secondary | ICD-10-CM

## 2020-06-23 DIAGNOSIS — Z01419 Encounter for gynecological examination (general) (routine) without abnormal findings: Secondary | ICD-10-CM

## 2020-06-23 DIAGNOSIS — Z8639 Personal history of other endocrine, nutritional and metabolic disease: Secondary | ICD-10-CM

## 2020-06-23 LAB — POCT URINE PREGNANCY: Preg Test, Ur: NEGATIVE

## 2020-06-23 MED ORDER — NORETHIN ACE-ETH ESTRAD-FE 1-20 MG-MCG PO TABS: 1.0000 | ORAL_TABLET | Freq: Every day | ORAL | 4 refills | Status: AC

## 2020-06-23 NOTE — Progress Notes (Signed)
ANNUAL PREVENTATIVE CARE GYN  ENCOUNTER NOTE  Subjective:       Morgan Pham is a 44 y.o. G28P0201 female here for a routine annual gynecologic exam.  Current complaints: 1.  Irregular menses and nausea on OCP  Denies difficulty breathing or respiratory distress, chest pain, abdominal pain, excessive vaginal bleeding, dysuria, and leg pain or swelling.    Gynecologic History  Patient's last menstrual period was 06/18/2020. Period Duration (Days): 5 Period Pattern: (!) Irregular Menstrual Flow: Light Menstrual Control: Panty liner Menstrual Control Change Freq (Hours): 2-3 Dysmenorrhea: (!) Moderate Dysmenorrhea Symptoms: Cramping, Headache   Upstream - 06/23/20 1015      Pregnancy Intention Screening   Does the patient want to become pregnant in the next year? No    Does the patient's partner want to become pregnant in the next year? No    Would the patient like to discuss contraceptive options today? Yes          The pregnancy intention screening data noted above was reviewed. Potential methods of contraception were discussed. The patient elected to proceed with Oral Contraceptive.   Last Pap: 04/2018. Results were: Neg/Neg  Last mammogram: due.  Obstetric History  OB History  Gravida Para Term Preterm AB Living  2 2   2   1   SAB TAB Ectopic Multiple Live Births        0 1    # Outcome Date GA Lbr Len/2nd Weight Sex Delivery Anes PTL Lv  2 Preterm 03/28/16 [redacted]w[redacted]d  4 lb 3.7 oz (1.92 kg) M CS-LTranv EPI  LIV  1 Preterm 05/2006 [redacted]w[redacted]d  5 lb 9 oz (2.523 kg) F Vag-Spont  N LIV    Past Medical History:  Diagnosis Date  . Allergy   . Asthma    childhood    Past Surgical History:  Procedure Laterality Date  . CESAREAN SECTION N/A 03/28/2016   Procedure: CESAREAN SECTION;  Surgeon: 03/30/2016, MD;  Location: [facility] ORS;  Service: Obstetrics;  Laterality: N/A;  . DILATION AND CURETTAGE OF UTERUS     poc x2  . LEEP     Hildred Laser has been getting pap's now yearly    No  current outpatient medications on file prior to visit.   No current facility-administered medications on file prior to visit.    Allergies  Allergen Reactions  . Shellfish Allergy   . Penicillins Rash  . Sulfa Antibiotics Rash    Social History   Socioeconomic History  . Marital status: Married    Spouse name: Not on file  . Number of children: Not on file  . Years of education: Not on file  . Highest education level: Not on file  Occupational History  . Occupation: paralegal  Tobacco Use  . Smoking status: Never Smoker  . Smokeless tobacco: Never Used  Vaping Use  . Vaping Use: Never used  Substance and Sexual Activity  . Alcohol use: Yes    Alcohol/week: 0.0 standard drinks    Comment: occasionally  . Drug use: No  . Sexual activity: Yes    Partners: Male    Birth control/protection: None, Pill  Other Topics Concern  . Not on file  Social History Narrative  . Not on file   Social Determinants of Health   Financial Resource Strain:   . Difficulty of Paying Living Expenses: Not on file  Food Insecurity:   . Worried About [name] in the Last Year: Not on file  . Ran  Out of Food in the Last Year: Not on file  Transportation Needs:   . Lack of Transportation (Medical): Not on file  . Lack of Transportation (Non-Medical): Not on file  Physical Activity:   . Days of Exercise per Week: Not on file  . Minutes of Exercise per Session: Not on file  Stress:   . Feeling of Stress : Not on file  Social Connections:   . Frequency of Communication with Friends and Family: Not on file  . Frequency of Social Gatherings with Friends and Family: Not on file  . Attends Religious Services: Not on file  . Active Member of Clubs or Organizations: Not on file  . Attends Banker Meetings: Not on file  . Marital Status: Not on file  Intimate Partner Violence:   . Fear of Current or Ex-Partner: Not on file  . Emotionally Abused: Not on file  .  Physically Abused: Not on file  . Sexually Abused: Not on file    Family History  Problem Relation Age of Onset  . Migraines Mother   . Asthma Father   . Heart failure Father        heart attack  . Hyperlipidemia Father   . Celiac disease Sister   . Celiac disease Brother   . Asthma Brother   . Meniere's disease Brother     The following portions of the patient's history were reviewed and updated as appropriate: allergies, current medications, past family history, past medical history, past social history, past surgical history and problem list.  Review of Systems  ROS negative except as noted above. Information obtained from patient.    Objective:   BP 115/82   Pulse 69   Ht 5\' 4"  (1.626 m)   Wt 176 lb 9.6 oz (80.1 kg)   LMP 06/18/2020   BMI 30.31 kg/m    CONSTITUTIONAL: Well-developed, well-nourished female in no acute distress.   PSYCHIATRIC: Normal mood and affect. Normal behavior. Normal judgment and thought content.  NEUROLGIC: Alert and oriented to person, place, and time. Normal muscle tone coordination. No cranial nerve deficit noted.  HENT:  Normocephalic, atraumatic, External right and left ear normal.   EYES: Conjunctivae and EOM are normal. Pupils are equal and round.   NECK: Normal range of motion, supple, no masses.  Normal thyroid.   SKIN: Skin is warm and dry. No rash noted. Not diaphoretic. No  erythema. No pallor.  CARDIOVASCULAR: Normal heart rate noted, regular rhythm, no murmur.  RESPIRATORY: Clear to auscultation bilaterally. Effort and breath sounds normal, no problems with respiration noted.  BREASTS: Symmetric in size. No masses, skin changes, nipple drainage, or lymphadenopathy.  ABDOMEN: Soft, normal bowel sounds, no distention noted.  No tenderness, rebound or guarding.   PELVIC:  External Genitalia: Normal  Vagina: Normal  Cervix: Normal  Uterus: Normal  Adnexa: Normal  MUSCULOSKELETAL: Normal range of motion. No tenderness.   No cyanosis, clubbing, or edema.  2+ distal pulses.  LYMPHATIC: No Axillary, Supraclavicular, or Inguinal Adenopathy.  Assessment:   Annual gynecologic examination 44 y.o.   Contraception: OCP (estrogen/progesterone)   Obesity 1   Problem List Items Addressed This Visit    None    Visit Diagnoses    Encounter for well woman exam with routine gynecological exam    -  Primary   Relevant Orders   MM 3D SCREEN BREAST BILATERAL   VITAMIN D 25 Hydroxy (Vit-D Deficiency, Fractures)   Lipid panel   Comprehensive metabolic panel  CBC   Hemoglobin A1c   Breast cancer screening by mammogram       Relevant Orders   MM 3D SCREEN BREAST BILATERAL   Missed menses       Relevant Orders   POCT urine pregnancy (Completed)   Surveillance for birth control, oral contraceptives       History of vitamin D deficiency       Relevant Orders   VITAMIN D 25 Hydroxy (Vit-D Deficiency, Fractures)   Elevated LDL cholesterol level       Relevant Orders   Lipid panel   BMI 30.0-30.9,adult       Relevant Orders   VITAMIN D 25 Hydroxy (Vit-D Deficiency, Fractures)   Lipid panel   Hemoglobin A1c      Plan:   Pap: Not needed  Mammogram: Ordered  Labs: See orders   Routine preventative health maintenance measures emphasized: Exercise/Diet/Weight control, Tobacco Warnings, Alcohol/Substance use risks and Stress Management; see AVS  Rx Junel, see orders  Reviewed red flag symptoms and when to call  Return to Clinic - 1 Year for [facility] and PAP or sooner if needed   Serafina Royals, CNM  [facility], [facility] 06/23/20 10:36 AM

## 2020-06-24 LAB — COMPREHENSIVE METABOLIC PANEL
ALT: 23 IU/L (ref 0–32)
AST: 17 IU/L (ref 0–40)
Albumin/Globulin Ratio: 1.5 (ref 1.2–2.2)
Albumin: 4.5 g/dL (ref 3.8–4.8)
Alkaline Phosphatase: 76 IU/L (ref 44–121)
BUN/Creatinine Ratio: 13 (ref 9–23)
BUN: 11 mg/dL (ref 6–24)
Bilirubin Total: 0.3 mg/dL (ref 0.0–1.2)
CO2: 24 mmol/L (ref 20–29)
Calcium: 9.6 mg/dL (ref 8.7–10.2)
Chloride: 104 mmol/L (ref 96–106)
Creatinine, Ser: 0.82 mg/dL (ref 0.57–1.00)
GFR calc Af Amer: 103 mL/min/{1.73_m2} (ref >59)
GFR calc non Af Amer: 89 mL/min/{1.73_m2} (ref >59)
Globulin, Total: 3.1 g/dL (ref 1.5–4.5)
Glucose: 96 mg/dL (ref 65–99)
Potassium: 4.6 mmol/L (ref 3.5–5.2)
Sodium: 142 mmol/L (ref 134–144)
Total Protein: 7.6 g/dL (ref 6.0–8.5)

## 2020-06-24 LAB — HEMOGLOBIN A1C
Est. average glucose Bld gHb Est-mCnc: 111 mg/dL
Hgb A1c MFr Bld: 5.5 % (ref 4.8–5.6)

## 2020-06-24 LAB — FSH/LH
FSH: 4.8 m[IU]/mL
LH: 6.3 m[IU]/mL

## 2020-06-24 LAB — CBC
Hematocrit: 42.8 % (ref 34.0–46.6)
Hemoglobin: 14 g/dL (ref 11.1–15.9)
MCH: 29 pg (ref 26.6–33.0)
MCHC: 32.7 g/dL (ref 31.5–35.7)
MCV: 89 fL (ref 79–97)
Platelets: 252 10*3/uL (ref 150–450)
RBC: 4.83 x10E6/uL (ref 3.77–5.28)
RDW: 12.4 % (ref 11.7–15.4)
WBC: 5.8 10*3/uL (ref 3.4–10.8)

## 2020-06-24 LAB — ESTRADIOL: Estradiol: 37.4 pg/mL

## 2020-06-24 LAB — LIPID PANEL
Chol/HDL Ratio: 3.2 ratio (ref 0.0–4.4)
Cholesterol, Total: 196 mg/dL (ref 100–199)
HDL: 61 mg/dL (ref >39)
LDL Chol Calc (NIH): 118 mg/dL — ABNORMAL HIGH (ref 0–99)
Triglycerides: 93 mg/dL (ref 0–149)
VLDL Cholesterol Cal: 17 mg/dL (ref 5–40)

## 2020-06-24 LAB — PROGESTERONE: Progesterone: 0.1 ng/mL

## 2020-06-24 LAB — VITAMIN D 25 HYDROXY (VIT D DEFICIENCY, FRACTURES): Vit D, 25-Hydroxy: 41.1 ng/mL (ref 30.0–100.0)

## 2021-06-29 ENCOUNTER — Encounter: Payer: BLUE CROSS/BLUE SHIELD | Admitting: Certified Nurse Midwife

## 2022-04-21 ENCOUNTER — Encounter: Payer: Self-pay | Admitting: Internal Medicine

## 2022-05-25 ENCOUNTER — Encounter: Payer: Self-pay | Admitting: Internal Medicine
